# Patient Record
Sex: Female | Born: 2000 | Race: Black or African American | Hispanic: No | Marital: Single | Smoking: Never smoker
Health system: Southern US, Community
[De-identification: ages and names within clinical notes are randomized; demographics above are authoritative.]

## PROBLEM LIST (undated history)

## (undated) ENCOUNTER — Emergency Department (HOSPITAL_COMMUNITY): Admission: EM | Payer: Medicaid Other | Source: Home / Self Care

## (undated) DIAGNOSIS — Q631 Lobulated, fused and horseshoe kidney: Secondary | ICD-10-CM

---
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
## 2021-04-06 ENCOUNTER — Ambulatory Visit
Admission: EM | Admit: 2021-04-06 | Discharge: 2021-04-06 | Disposition: A | Discharge status: Home / Self Care | Payer: Medicaid Other | Attending: Internal Medicine | Admitting: Internal Medicine

## 2021-04-06 ENCOUNTER — Other Ambulatory Visit: Payer: Self-pay

## 2021-04-06 DIAGNOSIS — Z113 Encounter for screening for infections with a predominantly sexual mode of transmission: Secondary | ICD-10-CM | POA: Insufficient documentation

## 2021-04-06 DIAGNOSIS — N898 Other specified noninflammatory disorders of vagina: Secondary | ICD-10-CM | POA: Diagnosis not present

## 2021-04-06 DIAGNOSIS — Z3202 Encounter for pregnancy test, result negative: Secondary | ICD-10-CM | POA: Insufficient documentation

## 2021-04-06 DIAGNOSIS — J069 Acute upper respiratory infection, unspecified: Secondary | ICD-10-CM | POA: Insufficient documentation

## 2021-04-06 DIAGNOSIS — J029 Acute pharyngitis, unspecified: Secondary | ICD-10-CM | POA: Diagnosis not present

## 2021-04-06 LAB — POCT MONO SCREEN (KUC): Mono, POC: NEGATIVE

## 2021-04-06 LAB — POCT URINALYSIS DIP (MANUAL ENTRY)
Bilirubin, UA: NEGATIVE
Glucose, UA: NEGATIVE mg/dL
Ketones, POC UA: NEGATIVE mg/dL
Leukocytes, UA: NEGATIVE
Nitrite, UA: NEGATIVE
Spec Grav, UA: 1.02 (ref 1.010–1.025)
Urobilinogen, UA: 0.2 E.U./dL
pH, UA: 7.5 (ref 5.0–8.0)

## 2021-04-06 LAB — POCT RAPID STREP A (OFFICE): Rapid Strep A Screen: NEGATIVE

## 2021-04-06 LAB — POCT URINE PREGNANCY: Preg Test, Ur: NEGATIVE

## 2021-04-06 NOTE — ED Provider Notes (Signed)
[facility]    CSN: [id number] Arrival date & time: 04/06/21  1645      History   Chief Complaint Chief Complaint  Patient presents with   Nasal Congestion   Cough    HPI Patricia Sullivan is a 20 y.o. female.   Patient presents with 3-day history of runny nose, cough, sore throat.  Patient reports that cough is productive.  Denies any fevers or known sick contacts.  Denies chest pain, shortness of breath, nausea, vomiting, abdominal pain, diarrhea.  Patient has not yet taken any medications to help alleviate symptoms.  Patient also requesting a monotest.  Patient also requesting routine STD testing including blood work to test for HIV and syphilis.  Patient denies any known exposure but has had unprotected sexual intercourse recently.  Patient reports that she has had a vaginal odor but denies urinary burning, urinary frequency, vaginal discharge, irregular vaginal bleeding, pelvic pain, fever, abdominal pain.  Patient is currently on her menstrual cycle.  Patient requesting urine pregnancy test as well.  Denies irregular menstrual cycles.  Patient is not currently on any type of birth control.   Cough  Past Medical History:  Diagnosis Date   Horseshoe kidney     Patient Active Problem List   Diagnosis Date Noted   Adjustment disorder with mixed anxiety and depressed mood 05/05/2020    History reviewed. No pertinent surgical history.  OB History   No obstetric history on file.      Home Medications    Prior to Admission medications   Medication Sig Start Date End Date Taking? Authorizing Provider  Mouthwash Compounding Base (MOUTH WASH-GP) LIQD Swish and spit 10 mLs as needed. Compounding solution;Maalox; 61ml,Lidocaine viscous 2%:80 ml; Benadryl 12.5mg /94ml; 80 ml 07/22/20   Lamptey, Myrene Galas, MD  naproxen (NAPROSYN) 375 MG tablet Take 1 tablet (375 mg total) by mouth 2 (two) times daily. 09/21/20   Dorie Rank, MD  ondansetron (ZOFRAN ODT) 8 MG  disintegrating tablet Take 1 tablet (8 mg total) by mouth every 8 (eight) hours as needed for nausea or vomiting. 09/21/20   Dorie Rank, MD  Vitamin D, Ergocalciferol, (DRISDOL) 1.25 MG (50000 UNIT) CAPS capsule Take 50,000 Units by mouth once a week. 01/25/21   [provider]    Family History Family History  Problem Relation Age of Onset   Healthy Mother    Healthy Father     Social History Social History   Tobacco Use   Smoking status: Never   Smokeless tobacco: Never  Substance Use Topics   Alcohol use: Yes    Comment: Occasionally    Drug use: Yes    Types: Marijuana    Comment: Occasionally     Allergies   Patient has no known allergies.   Review of Systems Review of Systems Per HPI  Physical Exam Triage Vital Signs ED Triage Vitals  Enc Vitals Group     BP 04/06/21 1718 111/71     Pulse Rate 04/06/21 1718 84     Resp 04/06/21 1718 18     Temp 04/06/21 1718 98.5 F (36.9 C)     Temp Source 04/06/21 1718 Oral     SpO2 04/06/21 1718 97 %     Weight --      Height --      Head Circumference --      Peak Flow --      Pain Score 04/06/21 1721 4     Pain Loc --  Pain Edu? --      Excl. in GC? --    No data found.  Updated Vital Signs BP 111/71 (BP Location: Right Arm)   Pulse 84   Temp 98.5 F (36.9 C) (Oral)   Resp 18   LMP 04/06/2021 (Exact Date)   SpO2 97%   Visual Acuity Right Eye Distance:   Left Eye Distance:   Bilateral Distance:    Right Eye Near:   Left Eye Near:    Bilateral Near:     Physical Exam Constitutional:      General: She is not in acute distress.    Appearance: Normal appearance. She is not toxic-appearing or diaphoretic.  HENT:     Head: Normocephalic and atraumatic.     Right Ear: Tympanic membrane and ear canal normal.     Left Ear: Tympanic membrane and ear canal normal.     Nose: Congestion present.     Mouth/Throat:     Mouth: Mucous membranes are moist.     Pharynx: Posterior oropharyngeal  erythema present.  Eyes:     Extraocular Movements: Extraocular movements intact.     Conjunctiva/sclera: Conjunctivae normal.     Pupils: Pupils are equal, round, and reactive to light.  Cardiovascular:     Rate and Rhythm: Normal rate and regular rhythm.     Pulses: Normal pulses.     Heart sounds: Normal heart sounds.  Pulmonary:     Effort: Pulmonary effort is normal. No respiratory distress.     Breath sounds: Normal breath sounds. No stridor. No wheezing, rhonchi or rales.  Abdominal:     General: Abdomen is flat. Bowel sounds are normal.     Palpations: Abdomen is soft.  Genitourinary:    Comments: Deferred with shared decision making.  Self swab performed. Musculoskeletal:        General: Normal range of motion.     Cervical back: Normal range of motion.  Skin:    General: Skin is warm and dry.  Neurological:     General: No focal deficit present.     Mental Status: She is alert and oriented to person, place, and time. Mental status is at baseline.  Psychiatric:        Mood and Affect: Mood normal.        Behavior: Behavior normal.     UC Treatments / Results  Labs (all labs ordered are listed, but only abnormal results are displayed) Labs Reviewed  POCT URINALYSIS DIP (MANUAL ENTRY) - Abnormal; Notable for the following components:      Result Value   Clarity, UA cloudy (*)    Blood, UA moderate (*)    Protein Ur, POC trace (*)    All other components within normal limits  CULTURE, GROUP A STREP ([facility])  COVID-19, FLU A+B AND RSV  HIV ANTIBODY (ROUTINE TESTING W REFLEX)  RPR  POCT RAPID STREP A (OFFICE)  POCT URINE PREGNANCY  POCT MONO SCREEN ([facility])  CERVICOVAGINAL ANCILLARY ONLY    EKG   Radiology No results found.  Procedures Procedures (including critical care time)  Medications Ordered in UC Medications - No data to display  Initial Impression / Assessment and Plan / UC Course  I have reviewed the triage vital signs and the nursing  notes.  Pertinent labs & imaging results that were available during my care of the patient were reviewed by me and considered in my medical decision making (see chart for details).     Patient presents  with symptoms likely from a viral upper respiratory infection. Differential includes bacterial pneumonia, sinusitis, allergic rhinitis, Covid 19, flu. Do not suspect underlying cardiopulmonary process. Symptoms seem unlikely related to ACS, CHF or COPD exacerbations, pneumonia, pneumothorax. Patient is nontoxic appearing and not in need of emergent medical intervention.  Rapid strep test was negative.  Rapid monotest negative.  Throat culture and COVID-19, flu, RSV test pending.  Recommended symptom control with over the counter medications: Daily oral anti-histamine, Oral decongestant or IN corticosteroid, saline irrigations, cepacol lozenges, Robitussin, Delsym, honey tea.  Urinalysis did not show signs of urinary tract infection.  Urine culture is pending.  Vaginal swab pending.  Will await results before treatment.  Patient to refrain from sexual activity until test results and treatment are complete.  HIV and syphilis blood work pending per patient request.  Advised patient that urine pregnancy test is not necessary given that she is currently on her menstrual cycle, but patient still requesting urine pregnancy test.  It was negative.  Return if symptoms fail to improve in 1-2 weeks or you develop shortness of breath, chest pain, severe headache. Patient states understanding and is agreeable.  Discharged with PCP followup.  Final Clinical Impressions(s) / UC Diagnoses   Final diagnoses:  Viral upper respiratory tract infection with cough  Sore throat  Vaginal odor  Screening examination for venereal disease  Pregnancy test negative     Discharge Instructions      You likely having a viral upper respiratory infection. We recommended symptom control. I expect your symptoms to start  improving in the next 1-2 weeks.   1. Take a daily allergy pill/anti-histamine like Zyrtec, Claritin, or Store brand consistently for 2 weeks  2. For congestion you may try an oral decongestant like Mucinex or sudafed. You may also try intranasal flonase nasal spray or saline irrigations (neti pot, sinus cleanse)  3. For your sore throat you may try cepacol lozenges, salt water gargles, throat spray. Treatment of congestion may also help your sore throat.  4. For cough you may try Robitussen, Mucinex DM  5. Take Tylenol or Ibuprofen to help with pain/inflammation  6. Stay hydrated, drink plenty of fluids to keep throat coated and less irritated  Honey Tea For cough/sore throat try using a honey-based tea. Use 3 teaspoons of honey with juice squeezed from half lemon. Place shaved pieces of ginger into 1/2-1 cup of water and warm over stove top. Then mix the ingredients and repeat every 4 hours as needed.   Your rapid strep test was negative.  Rapid monotest was negative.  Urine pregnancy was negative.  COVID-19, flu, RSV test is pending.  Blood work is pending.  Vaginal swab is pending.  We will call if any of these things are positive.  Please refrain from sexual activity until test results are complete.     ED Prescriptions   None    PDMP not reviewed this encounter.   Teodora Medici, [place] 04/06/21 [phone]

## 2021-04-06 NOTE — ED Triage Notes (Signed)
3 day h/o rhinorrhea and cough. Pt reports at the onset she had a sore throat w/dysphagia that has since resolved. Pt also complains of HA and back aches, but she's not sure if it's related to starting her MP.  Pt is also requesting STD testing including blood work, Upreg and a mono screen. Denies sxs, but notes dx of chlamydia in 8/22 and while being tx Pt continued to engage in intercourse.

## 2021-04-06 NOTE — Discharge Instructions (Signed)
You likely having a viral upper respiratory infection. We recommended symptom control. I expect your symptoms to start improving in the next 1-2 weeks.   1. Take a daily allergy pill/anti-histamine like Zyrtec, Claritin, or Store brand consistently for 2 weeks  2. For congestion you may try an oral decongestant like Mucinex or sudafed. You may also try intranasal flonase nasal spray or saline irrigations (neti pot, sinus cleanse)  3. For your sore throat you may try cepacol lozenges, salt water gargles, throat spray. Treatment of congestion may also help your sore throat.  4. For cough you may try Robitussen, Mucinex DM  5. Take Tylenol or Ibuprofen to help with pain/inflammation  6. Stay hydrated, drink plenty of fluids to keep throat coated and less irritated  Honey Tea For cough/sore throat try using a honey-based tea. Use 3 teaspoons of honey with juice squeezed from half lemon. Place shaved pieces of ginger into 1/2-1 cup of water and warm over stove top. Then mix the ingredients and repeat every 4 hours as needed.   Your rapid strep test was negative.  Rapid monotest was negative.  Urine pregnancy was negative.  COVID-19, flu, RSV test is pending.  Blood work is pending.  Vaginal swab is pending.  We will call if any of these things are positive.  Please refrain from sexual activity until test results are complete.

## 2021-04-07 ENCOUNTER — Telehealth (HOSPITAL_COMMUNITY): Payer: Self-pay | Admitting: Emergency Medicine

## 2021-04-07 LAB — CERVICOVAGINAL ANCILLARY ONLY
Bacterial Vaginitis (gardnerella): POSITIVE — AB
Candida Glabrata: NEGATIVE
Candida Vaginitis: POSITIVE — AB
Chlamydia: NEGATIVE
Comment: NEGATIVE
Comment: NEGATIVE
Comment: NEGATIVE
Comment: NEGATIVE
Comment: NEGATIVE
Comment: NORMAL
Neisseria Gonorrhea: NEGATIVE
Trichomonas: NEGATIVE

## 2021-04-07 LAB — COVID-19, FLU A+B AND RSV
Influenza A, NAA: NOT DETECTED
Influenza B, NAA: NOT DETECTED
RSV, NAA: NOT DETECTED
SARS-CoV-2, NAA: NOT DETECTED

## 2021-04-07 LAB — RPR: RPR Ser Ql: NONREACTIVE

## 2021-04-07 LAB — HIV ANTIBODY (ROUTINE TESTING W REFLEX): HIV Screen 4th Generation wRfx: NONREACTIVE

## 2021-04-07 MED ORDER — METRONIDAZOLE 500 MG PO TABS: 500.0000 mg | ORAL_TABLET | Freq: Two times a day (BID) | ORAL | 0 refills | Status: DC

## 2021-04-07 MED ORDER — FLUCONAZOLE 150 MG PO TABS: 150.0000 mg | ORAL_TABLET | Freq: Once | ORAL | 0 refills | Status: AC

## 2021-04-10 LAB — CULTURE, GROUP A STREP (THRC)

## 2021-05-17 ENCOUNTER — Encounter: Payer: Self-pay | Admitting: Emergency Medicine

## 2021-05-17 ENCOUNTER — Ambulatory Visit
Admission: EM | Admit: 2021-05-17 | Discharge: 2021-05-17 | Disposition: A | Discharge status: Home / Self Care | Payer: Medicaid Other | Attending: Internal Medicine | Admitting: Internal Medicine

## 2021-05-17 ENCOUNTER — Other Ambulatory Visit: Payer: Self-pay

## 2021-05-17 DIAGNOSIS — N898 Other specified noninflammatory disorders of vagina: Secondary | ICD-10-CM | POA: Diagnosis not present

## 2021-05-17 DIAGNOSIS — Z113 Encounter for screening for infections with a predominantly sexual mode of transmission: Secondary | ICD-10-CM | POA: Insufficient documentation

## 2021-05-17 LAB — POCT URINE PREGNANCY: Preg Test, Ur: NEGATIVE

## 2021-05-17 NOTE — ED Triage Notes (Signed)
Didn't take her BV pills as prescribed. Over the last two days noticed a cottage cheese discharge, and has a strong vaginal odor.

## 2021-05-17 NOTE — ED Provider Notes (Signed)
[name]    CSN: [id number] Arrival date & time: 05/17/21  1120      History   Chief Complaint Chief Complaint  Patient presents with   Vaginal Discharge    HPI Patricia Sullivan is a 20 y.o. female.   Patient presents with white vaginal discharge that started approximately 3 to 4 days ago.  Is also having a vaginal odor.  Denies any urinary burning, urinary frequency, hematuria, pelvic pain, abdominal pain, fever, back pain.  Denies any known exposure to STD.  Patient was recently seen on 04/06/2021 and tested positive for bacterial vaginosis as well as vaginal yeast.  Was prescribed Flagyl and Diflucan.  Patient reports that she did not complete the Flagyl and had approximately 3 pills left.  Although, those symptoms resolved and these are new symptoms.  Patient also requesting pregnancy test.  Denies missed period.  Last menstrual cycle was 05/01/21.    Vaginal Discharge  Past Medical History:  Diagnosis Date   Horseshoe kidney     Patient Active Problem List   Diagnosis Date Noted   Adjustment disorder with mixed anxiety and depressed mood 05/05/2020    History reviewed. No pertinent surgical history.  OB History   No obstetric history on file.      Home Medications    Prior to Admission medications   Medication Sig Start Date End Date Taking? Authorizing Provider  metroNIDAZOLE (FLAGYL) 500 MG tablet Take 1 tablet (500 mg total) by mouth 2 (two) times daily. 04/07/21   Lamptey, Myrene Galas, MD  Mouthwash Compounding Base (MOUTH WASH-GP) LIQD Swish and spit 10 mLs as needed. Compounding solution;Maalox; 7ml,Lidocaine viscous 2%:80 ml; Benadryl 12.5mg /8ml; 80 ml 07/22/20   Lamptey, Myrene Galas, MD  naproxen (NAPROSYN) 375 MG tablet Take 1 tablet (375 mg total) by mouth 2 (two) times daily. 09/21/20   Dorie Rank, MD  ondansetron (ZOFRAN ODT) 8 MG disintegrating tablet Take 1 tablet (8 mg total) by mouth every 8 (eight) hours as needed for nausea or vomiting.  09/21/20   Dorie Rank, MD  Vitamin D, Ergocalciferol, (DRISDOL) 1.25 MG (50000 UNIT) CAPS capsule Take 50,000 Units by mouth once a week. 01/25/21   [provider]    Family History Family History  Problem Relation Age of Onset   Healthy Mother    Healthy Father     Social History Social History   Tobacco Use   Smoking status: Never   Smokeless tobacco: Never  Substance Use Topics   Alcohol use: Yes    Comment: Occasionally    Drug use: Yes    Types: Marijuana    Comment: Occasionally     Allergies   Patient has no known allergies.   Review of Systems Review of Systems Per HPI  Physical Exam Triage Vital Signs ED Triage Vitals  Enc Vitals Group     BP 05/17/21 1217 117/71     Pulse Rate 05/17/21 1217 100     Resp 05/17/21 1217 16     Temp 05/17/21 1217 98.6 F (37 C)     Temp Source 05/17/21 1217 Oral     SpO2 05/17/21 1217 100 %     Weight --      Height --      Head Circumference --      Peak Flow --      Pain Score 05/17/21 1223 0     Pain Loc --      Pain Edu? --  Excl. in GC? --    No data found.  Updated Vital Signs BP 117/71 (BP Location: Left Arm)   Pulse 100   Temp 98.6 F (37 C) (Oral)   Resp 16   SpO2 100%   Visual Acuity Right Eye Distance:   Left Eye Distance:   Bilateral Distance:    Right Eye Near:   Left Eye Near:    Bilateral Near:     Physical Exam Constitutional:      General: She is not in acute distress.    Appearance: Normal appearance. She is not toxic-appearing or diaphoretic.  HENT:     Head: Normocephalic and atraumatic.  Eyes:     Extraocular Movements: Extraocular movements intact.     Conjunctiva/sclera: Conjunctivae normal.  Pulmonary:     Effort: Pulmonary effort is normal.  Genitourinary:    Comments: Deferred with shared decision making.  Self swab performed. Neurological:     General: No focal deficit present.     Mental Status: She is alert and oriented to person, place, and time.  Mental status is at baseline.  Psychiatric:        Mood and Affect: Mood normal.        Behavior: Behavior normal.        Thought Content: Thought content normal.        Judgment: Judgment normal.     UC Treatments / Results  Labs (all labs ordered are listed, but only abnormal results are displayed) Labs Reviewed  POCT URINE PREGNANCY  CERVICOVAGINAL ANCILLARY ONLY    EKG   Radiology No results found.  Procedures Procedures (including critical care time)  Medications Ordered in UC Medications - No data to display  Initial Impression / Assessment and Plan / UC Course  I have reviewed the triage vital signs and the nursing notes.  Pertinent labs & imaging results that were available during my care of the patient were reviewed by me and considered in my medical decision making (see chart for details).     Will await cervicovaginal swab to determine what could be causing vaginal discharge as these are new symptoms from previous urgent care visit.  Patient to refrain from sexual activity until test results and treatment are complete.  Discussed strict return precautions.  Patient verbalized understanding and was agreeable with plan. Final Clinical Impressions(s) / UC Diagnoses   Final diagnoses:  Vaginal discharge  Screening examination for venereal disease     Discharge Instructions      Your vaginal swab is pending.  We will call if it is positive and treat as appropriate.  Please refrain from sexual activity until test results and treatment are complete.  Urine pregnancy test was negative.     ED Prescriptions   None    PDMP not reviewed this encounter.   Gustavus Bryant, [place] 05/17/21 1252

## 2021-05-17 NOTE — Discharge Instructions (Signed)
Your vaginal swab is pending.  We will call if it is positive and treat as appropriate.  Please refrain from sexual activity until test results and treatment are complete.  Urine pregnancy test was negative.

## 2021-05-18 LAB — CERVICOVAGINAL ANCILLARY ONLY
Bacterial Vaginitis (gardnerella): POSITIVE — AB
Candida Glabrata: NEGATIVE
Candida Vaginitis: POSITIVE — AB
Chlamydia: NEGATIVE
Comment: NEGATIVE
Comment: NEGATIVE
Comment: NEGATIVE
Comment: NEGATIVE
Comment: NEGATIVE
Comment: NORMAL
Neisseria Gonorrhea: POSITIVE — AB
Trichomonas: NEGATIVE

## 2021-05-19 ENCOUNTER — Telehealth (HOSPITAL_COMMUNITY): Payer: Self-pay | Admitting: Emergency Medicine

## 2021-05-19 MED ORDER — FLUCONAZOLE 150 MG PO TABS: 150.0000 mg | ORAL_TABLET | Freq: Once | ORAL | 0 refills | Status: AC

## 2021-05-19 MED ORDER — METRONIDAZOLE 500 MG PO TABS: 500.0000 mg | ORAL_TABLET | Freq: Two times a day (BID) | ORAL | 0 refills | Status: DC

## 2021-05-19 NOTE — Telephone Encounter (Signed)
Per protocol, patient will need treatment with IM Rocephin 500mg  for positive Gonorrhea. Will also need treatment with Metronidazole and Diflucan.   Contacted patient by phone.  Verified identity using two identifiers.  Provided positive result.  Reviewed safe sex practices, notifying partners, and refraining from sexual activities for 7 days from time of treatment.  Patient verified understanding, all questions answered.   [facility]HS notified Verified pharmacy, prescriptions sent

## 2021-05-21 ENCOUNTER — Other Ambulatory Visit: Payer: Self-pay

## 2021-05-21 ENCOUNTER — Ambulatory Visit
Admission: RE | Admit: 2021-05-21 | Discharge: 2021-05-21 | Disposition: A | Discharge status: Home / Self Care | Payer: Medicaid Other | Source: Ambulatory Visit | Attending: Internal Medicine | Admitting: Internal Medicine

## 2021-05-21 VITALS — BP 110/73 | HR 80 | Temp 98.5°F | Resp 18

## 2021-05-21 DIAGNOSIS — A549 Gonococcal infection, unspecified: Secondary | ICD-10-CM

## 2021-05-21 DIAGNOSIS — Z113 Encounter for screening for infections with a predominantly sexual mode of transmission: Secondary | ICD-10-CM

## 2021-05-21 MED ORDER — CEFTRIAXONE SODIUM 1 G IJ SOLR
0.5000 g | Freq: Once | INTRAMUSCULAR | Status: AC
  Administered 2021-05-21: 0.5 g via INTRAMUSCULAR

## 2021-05-21 NOTE — ED Triage Notes (Signed)
Pt here for rn visit for sti result.   However also asking for throat and anal swabs to be sent so will need to be clinical visit.

## 2021-05-21 NOTE — Discharge Instructions (Addendum)
rocephin injection was administered in urgent care today for gonorrhea.  Your blood work is pending.  We will call if it is positive.

## 2021-05-21 NOTE — ED Provider Notes (Signed)
[name]    CSN: [id number] Arrival date & time: 05/21/21  1756      History   Chief Complaint Chief Complaint  Patient presents with   sti screening    HPI Patricia Sullivan is a 20 y.o. female.   Patient originally presented today for treatment for gonorrhea as she tested positive for gonorrhea on 05/17/2021.  Although, parent is present with patient today and is requesting oral and anal swabs for STDs as well as blood work for HIV and syphilis.  Patient denies that she is having any sore throat or anal symptoms related to STDs.  Denies any known exposure to HIV and syphilis but requesting routine testing and is agreeable with parent for blood work.    Past Medical History:  Diagnosis Date   Horseshoe kidney     Patient Active Problem List   Diagnosis Date Noted   Adjustment disorder with mixed anxiety and depressed mood 05/05/2020    History reviewed. No pertinent surgical history.  OB History   No obstetric history on file.      Home Medications    Prior to Admission medications   Medication Sig Start Date End Date Taking? Authorizing Provider  metroNIDAZOLE (FLAGYL) 500 MG tablet Take 1 tablet (500 mg total) by mouth 2 (two) times daily. 05/19/21   Lamptey, Britta Mccreedy, MD  Mouthwash Compounding Base (MOUTH WASH-GP) LIQD Swish and spit 10 mLs as needed. Compounding solution;Maalox; 52ml,Lidocaine viscous 2%:80 ml; Benadryl 12.5mg /23ml; 80 ml 07/22/20   Lamptey, Britta Mccreedy, MD  naproxen (NAPROSYN) 375 MG tablet Take 1 tablet (375 mg total) by mouth 2 (two) times daily. 09/21/20   Linwood Dibbles, MD  ondansetron (ZOFRAN ODT) 8 MG disintegrating tablet Take 1 tablet (8 mg total) by mouth every 8 (eight) hours as needed for nausea or vomiting. 09/21/20   Linwood Dibbles, MD  Vitamin D, Ergocalciferol, (DRISDOL) 1.25 MG (50000 UNIT) CAPS capsule Take 50,000 Units by mouth once a week. 01/25/21   [provider]    Family History Family History  Problem  Relation Age of Onset   Healthy Mother    Healthy Father     Social History Social History   Tobacco Use   Smoking status: Never   Smokeless tobacco: Never  Substance Use Topics   Alcohol use: Yes    Comment: Occasionally    Drug use: Yes    Types: Marijuana    Comment: Occasionally     Allergies   Patient has no known allergies.   Review of Systems Review of Systems Per HPI  Physical Exam Triage Vital Signs ED Triage Vitals  Enc Vitals Group     BP 05/21/21 1915 110/73     Pulse Rate 05/21/21 1915 80     Resp 05/21/21 1915 18     Temp 05/21/21 1915 98.5 F (36.9 C)     Temp Source 05/21/21 1915 Oral     SpO2 05/21/21 1915 98 %     Weight --      Height --      Head Circumference --      Peak Flow --      Pain Score 05/21/21 1914 0     Pain Loc --      Pain Edu? --      Excl. in GC? --    No data found.  Updated Vital Signs BP 110/73 (BP Location: Right Arm)    Pulse 80    Temp 98.5 F (36.9  C) (Oral)    Resp 18    SpO2 98%   Visual Acuity Right Eye Distance:   Left Eye Distance:   Bilateral Distance:    Right Eye Near:   Left Eye Near:    Bilateral Near:     Physical Exam Constitutional:      General: She is not in acute distress.    Appearance: Normal appearance. She is not toxic-appearing or diaphoretic.  HENT:     Head: Normocephalic and atraumatic.  Eyes:     Extraocular Movements: Extraocular movements intact.     Conjunctiva/sclera: Conjunctivae normal.  Pulmonary:     Effort: Pulmonary effort is normal.  Neurological:     General: No focal deficit present.     Mental Status: She is alert and oriented to person, place, and time. Mental status is at baseline.  Psychiatric:        Mood and Affect: Mood normal.        Behavior: Behavior normal.        Thought Content: Thought content normal.        Judgment: Judgment normal.     UC Treatments / Results  Labs (all labs ordered are listed, but only abnormal results are  displayed) Labs Reviewed  HIV ANTIBODY (ROUTINE TESTING W REFLEX)  RPR    EKG   Radiology No results found.  Procedures Procedures (including critical care time)  Medications Ordered in UC Medications  cefTRIAXone (ROCEPHIN) injection 0.5 g (0.5 g Intramuscular Given 05/21/21 1928)    Initial Impression / Assessment and Plan / UC Course  I have reviewed the triage vital signs and the nursing notes.  Pertinent labs & imaging results that were available during my care of the patient were reviewed by me and considered in my medical decision making (see chart for details).     IM Rocephin administered in urgent care today for treatment of gonorrhea with results that were positive on 05/17/2021.  Discussed oral and anal swabs for STDs with parent and patient in room.  Advised parent and patient that it was not necessary given that she is not having any symptoms and is already receiving treatment for gonorrhea today.  Although, advised parent and patient that oral and anal swabs could be completed in urgent care today if they wish for it to be.  Patient declined oral or anal swab testing.  Although, she still wants HIV and syphilis testing.  Blood work pending.  No known exposures at this time.  Discussed strict return precautions.  Patient verbalized understanding and was agreeable with plan. Final Clinical Impressions(s) / UC Diagnoses   Final diagnoses:  Screening examination for venereal disease  Gonorrhea     Discharge Instructions      rocephin injection was administered in urgent care today for gonorrhea.  Your blood work is pending.  We will call if it is positive.    ED Prescriptions   None    PDMP not reviewed this encounter.   Teodora Medici, [place] 05/21/21 [phone]

## 2021-05-25 LAB — HIV ANTIBODY (ROUTINE TESTING W REFLEX): HIV Screen 4th Generation wRfx: NONREACTIVE

## 2021-05-25 LAB — RPR: RPR Ser Ql: NONREACTIVE

## 2021-06-04 ENCOUNTER — Encounter (HOSPITAL_BASED_OUTPATIENT_CLINIC_OR_DEPARTMENT_OTHER): Payer: Self-pay | Admitting: Emergency Medicine

## 2021-06-04 ENCOUNTER — Emergency Department (HOSPITAL_BASED_OUTPATIENT_CLINIC_OR_DEPARTMENT_OTHER)
Admission: EM | Admit: 2021-06-04 | Discharge: 2021-06-04 | Disposition: A | Discharge status: Home / Self Care | Payer: Medicaid Other | Attending: Emergency Medicine | Admitting: Emergency Medicine

## 2021-06-04 ENCOUNTER — Other Ambulatory Visit: Payer: Self-pay

## 2021-06-04 DIAGNOSIS — R519 Headache, unspecified: Secondary | ICD-10-CM | POA: Diagnosis not present

## 2021-06-04 DIAGNOSIS — R197 Diarrhea, unspecified: Secondary | ICD-10-CM | POA: Diagnosis present

## 2021-06-04 DIAGNOSIS — Z20822 Contact with and (suspected) exposure to covid-19: Secondary | ICD-10-CM | POA: Insufficient documentation

## 2021-06-04 DIAGNOSIS — R112 Nausea with vomiting, unspecified: Secondary | ICD-10-CM

## 2021-06-04 LAB — CBC
HCT: 39.3 % (ref 36.0–46.0)
Hemoglobin: 12.6 g/dL (ref 12.0–15.0)
MCH: 29.4 pg (ref 26.0–34.0)
MCHC: 32.1 g/dL (ref 30.0–36.0)
MCV: 91.8 fL (ref 80.0–100.0)
Platelets: 263 10*3/uL (ref 150–400)
RBC: 4.28 MIL/uL (ref 3.87–5.11)
RDW: 13.4 % (ref 11.5–15.5)
WBC: 5.1 10*3/uL (ref 4.0–10.5)
nRBC: 0 % (ref 0.0–0.2)

## 2021-06-04 LAB — COMPREHENSIVE METABOLIC PANEL
ALT: 12 U/L (ref 0–44)
AST: 17 U/L (ref 15–41)
Albumin: 4.4 g/dL (ref 3.5–5.0)
Alkaline Phosphatase: 35 U/L — ABNORMAL LOW (ref 38–126)
Anion gap: 8 (ref 5–15)
BUN: 13 mg/dL (ref 6–20)
CO2: 25 mmol/L (ref 22–32)
Calcium: 9.3 mg/dL (ref 8.9–10.3)
Chloride: 104 mmol/L (ref 98–111)
Creatinine, Ser: 0.71 mg/dL (ref 0.44–1.00)
GFR, Estimated: >60 mL/min (ref >60)
Glucose, Bld: 92 mg/dL (ref 70–99)
Potassium: 4 mmol/L (ref 3.5–5.1)
Sodium: 137 mmol/L (ref 135–145)
Total Bilirubin: 1.6 mg/dL — ABNORMAL HIGH (ref 0.3–1.2)
Total Protein: 7.7 g/dL (ref 6.5–8.1)

## 2021-06-04 LAB — URINALYSIS, ROUTINE W REFLEX MICROSCOPIC
Bilirubin Urine: NEGATIVE
Glucose, UA: NEGATIVE mg/dL
Ketones, ur: >80 mg/dL — AB
Nitrite: NEGATIVE
RBC / HPF: >50 RBC/hpf — ABNORMAL HIGH (ref 0–5)
Specific Gravity, Urine: 1.029 (ref 1.005–1.030)
pH: 5.5 (ref 5.0–8.0)

## 2021-06-04 LAB — PREGNANCY, URINE: Preg Test, Ur: NEGATIVE

## 2021-06-04 LAB — RESP PANEL BY RT-PCR (FLU A&B, COVID) ARPGX2
Influenza A by PCR: NEGATIVE
Influenza B by PCR: NEGATIVE
SARS Coronavirus 2 by RT PCR: NEGATIVE

## 2021-06-04 LAB — LIPASE, BLOOD: Lipase: 11 U/L (ref 11–51)

## 2021-06-04 MED ORDER — METOCLOPRAMIDE HCL 5 MG/ML IJ SOLN
5.0000 mg | Freq: Once | INTRAMUSCULAR | Status: AC
  Administered 2021-06-04: 10:00:00 5 mg via INTRAVENOUS
  Filled 2021-06-04: qty 2

## 2021-06-04 MED ORDER — ONDANSETRON HCL 4 MG PO TABS
4.0000 mg | ORAL_TABLET | Freq: Four times a day (QID) | ORAL | 0 refills | Status: AC
Start: 2021-06-04 — End: 2021-06-07

## 2021-06-04 MED ORDER — DICYCLOMINE HCL 20 MG PO TABS: 20.0000 mg | ORAL_TABLET | Freq: Two times a day (BID) | ORAL | 0 refills | Status: DC | PRN

## 2021-06-04 MED ORDER — SODIUM CHLORIDE 0.9 % IV BOLUS
500.0000 mL | Freq: Once | INTRAVENOUS | Status: AC
  Administered 2021-06-04: 11:00:00 500 mL via INTRAVENOUS

## 2021-06-04 MED ORDER — DICYCLOMINE HCL 10 MG PO CAPS
20.0000 mg | ORAL_CAPSULE | Freq: Once | ORAL | Status: AC
  Administered 2021-06-04: 10:00:00 20 mg via ORAL
  Filled 2021-06-04: qty 2

## 2021-06-04 MED ORDER — ONDANSETRON HCL 4 MG/2ML IJ SOLN
4.0000 mg | Freq: Once | INTRAMUSCULAR | Status: AC
  Administered 2021-06-04: 10:00:00 4 mg via INTRAVENOUS
  Filled 2021-06-04: qty 2

## 2021-06-04 NOTE — Discharge Instructions (Addendum)
You are seen in the ER for nausea, vomiting and diarrhea.  We talked about the differential for these symptoms, which would include a viral stomach bug is the most likely cause.  Your blood test showed a normal white blood cell count, no evidence of inflammation around your liver or pancreas.  However we also talked about the possibility this may be early appendicitis.  The only way to determine this is with a CT scan.  You did not want a CT scan at this time, and we engaged in shared medical decision making.  I thought it was reasonable to discharge you home and have you continue to watch and wait with your symptoms.  If your vomiting is getting worse, or your pain is localizing into the right lower side of your abdomen, you need to return to the emergency department.  Your urine test also had signs of blood in it.  This may be a sign of a kidney stone.  Most kidney stones passed on their own.  However this would also be diagnosed with a CT scan.  If your symptoms are getting worse, you should come back to the ER, as you will likely need another medical evaluation and possible CT scan at that time.  For now, I will prescribe you Zofran for nausea and Bentyl for abdominal pain and cramping.  If you have a stomach bug expect you feeling better within the next 2 days.  Please call to schedule follow-up appointment with your primary care provider

## 2021-06-04 NOTE — ED Provider Notes (Signed)
[place] [facility] Provider Note   CSN: [id number] Arrival date & time: 06/04/21  [id number]     History Chief Complaint  Patient presents with   Emesis   Diarrhea    Patricia Sullivan is a 20 y.o. female presenting to the emergency department with nausea, vomiting, diarrhea for the past 2 days.  The patient reports that she has had diffuse lower abdominal cramping, more so on the right lower side, for the past 2 days, as well as several episodes of vomiting that began abruptly, and very loose bowel movements.  She is also had some cramping pain in her back since yesterday.  She reports a headache as well.  She says she has not been able to keep down any fluids.  She denies any history of abdominal surgeries.  She denies any concerns for pregnancy.  She reports that she completed a course of Flagyl 3 days ago for BV, denies any alcohol use.  HPI     Past Medical History:  Diagnosis Date   Horseshoe kidney     Patient Active Problem List   Diagnosis Date Noted   Adjustment disorder with mixed anxiety and depressed mood 05/05/2020    History reviewed. No pertinent surgical history.   OB History   No obstetric history on file.     Family History  Problem Relation Age of Onset   Healthy Mother    Healthy Father     Social History   Tobacco Use   Smoking status: Never   Smokeless tobacco: Never  Substance Use Topics   Alcohol use: Yes    Comment: Occasionally    Drug use: Yes    Types: Marijuana    Comment: Occasionally    Home Medications Prior to Admission medications   Medication Sig Start Date End Date Taking? Authorizing Provider  dicyclomine (BENTYL) 20 MG tablet Take 1 tablet (20 mg total) by mouth 2 (two) times daily as needed for up to 20 doses for spasms. 06/04/21  Yes Wyvonnia Dusky, MD  ondansetron (ZOFRAN) 4 MG tablet Take 1 tablet (4 mg total) by mouth every 6 (six) hours for 12 doses. 06/04/21 06/07/21 Yes Jaydrien Wassenaar, Carola Rhine, MD   metroNIDAZOLE (FLAGYL) 500 MG tablet Take 1 tablet (500 mg total) by mouth 2 (two) times daily. 05/19/21   Lamptey, Myrene Galas, MD  Mouthwash Compounding Base (MOUTH WASH-GP) LIQD Swish and spit 10 mLs as needed. Compounding solution;Maalox; 26ml,Lidocaine viscous 2%:80 ml; Benadryl 12.5mg /32ml; 80 ml 07/22/20   Lamptey, Myrene Galas, MD  naproxen (NAPROSYN) 375 MG tablet Take 1 tablet (375 mg total) by mouth 2 (two) times daily. 09/21/20   Dorie Rank, MD  ondansetron (ZOFRAN ODT) 8 MG disintegrating tablet Take 1 tablet (8 mg total) by mouth every 8 (eight) hours as needed for nausea or vomiting. 09/21/20   Dorie Rank, MD  Vitamin D, Ergocalciferol, (DRISDOL) 1.25 MG (50000 UNIT) CAPS capsule Take 50,000 Units by mouth once a week. 01/25/21   [provider]    Allergies    Patient has no known allergies.  Review of Systems   Review of Systems  Constitutional:  Positive for fatigue. Negative for chills and fever.  HENT:  Negative for ear pain and sore throat.   Eyes:  Negative for pain and visual disturbance.  Respiratory:  Negative for cough and shortness of breath.   Cardiovascular:  Negative for chest pain and palpitations.  Gastrointestinal:  Positive for abdominal pain, diarrhea, nausea and vomiting.  Genitourinary:  Negative for dysuria and hematuria.  Musculoskeletal:  Positive for arthralgias and back pain.  Skin:  Negative for color change and rash.  Neurological:  Positive for headaches. Negative for syncope.  All other systems reviewed and are negative.  Physical Exam Updated Vital Signs BP 100/65    Pulse 99    Temp 99.1 F (37.3 C)    Resp 14    Ht 5\' 3"  (1.6 m)    Wt 49.9 kg    LMP 05/19/2021 (Approximate)    SpO2 100%    BMI 19.49 kg/m   Physical Exam Constitutional:      General: She is not in acute distress. HENT:     Head: Normocephalic and atraumatic.  Eyes:     Conjunctiva/sclera: Conjunctivae normal.     Pupils: Pupils are equal, round, and reactive to  light.  Cardiovascular:     Rate and Rhythm: Normal rate and regular rhythm.  Pulmonary:     Effort: Pulmonary effort is normal. No respiratory distress.  Abdominal:     General: There is no distension.     Tenderness: There is generalized abdominal tenderness. There is no right CVA tenderness or guarding. Negative signs include Murphy's sign and McBurney's sign.     Comments: Mild ttp  Skin:    General: Skin is warm and dry.  Neurological:     General: No focal deficit present.     Mental Status: She is alert. Mental status is at baseline.  Psychiatric:        Mood and Affect: Mood normal.        Behavior: Behavior normal.    ED Results / Procedures / Treatments   Labs (all labs ordered are listed, but only abnormal results are displayed) Labs Reviewed  COMPREHENSIVE METABOLIC PANEL - Abnormal; Notable for the following components:      Result Value   Alkaline Phosphatase 35 (*)    Total Bilirubin 1.6 (*)    All other components within normal limits  URINALYSIS, ROUTINE W REFLEX MICROSCOPIC - Abnormal; Notable for the following components:   Hgb urine dipstick LARGE (*)    Ketones, ur >80 (*)    Protein, ur TRACE (*)    Leukocytes,Ua SMALL (*)    RBC / HPF >50 (*)    All other components within normal limits  RESP PANEL BY RT-PCR (FLU A&B, COVID) ARPGX2  LIPASE, BLOOD  CBC  PREGNANCY, URINE    EKG None  Radiology No results found.  Procedures Procedures   Medications Ordered in ED Medications  dicyclomine (BENTYL) capsule 20 mg (20 mg Oral Given 06/04/21 1029)  sodium chloride 0.9 % bolus 500 mL (0 mLs Intravenous Stopped 06/04/21 1141)  ondansetron (ZOFRAN) injection 4 mg (4 mg Intravenous Given 06/04/21 1029)  metoCLOPramide (REGLAN) injection 5 mg (5 mg Intravenous Given 06/04/21 1029)    ED Course  I have reviewed the triage vital signs and the nursing notes.  Pertinent labs & imaging results that were available during my care of the patient were  reviewed by me and considered in my medical decision making (see chart for details).  This patient complains of, vomiting, diarrhea.  This involves an extensive number of treatment options, and is a complaint that carries with it a high risk of complications and morbidity.  The differential diagnosis includes viral gastroenteritis versus early appendicitis versus colitis versus other GI process.  She has no rigidity or guarding of the abdominal exam to suggest GI perforation or surgical abdomen.  There is some vague tenderness suprapubically and in the right lower quadrant, with no focal tenderness at McBurney's point.  We discussed the option of a CT scan to evaluate for early appendicitis, but she would like more time to think about this.  The alternative would be watchful waiting at home with symptomatic management for suspected viral gastroenteritis.  I ordered, reviewed, and interpreted labs.  No life-threatening abnormalities were noted on these tests.  White blood cell count is normal.  UA does not show evidence of infection, but does note large hemoglobin (she is not on her menses) as well as protein and ketones.  Her creatinine is within normal limits.  Electrolytes within normal limits.  She does feel dehydrated, with ketones in the urine, I think a small fluid bolus would be reasonable.  I have also ordered IV Reglan for her headache and stomachache, as well as Bentyl and IV Zofran.  On my reassessment the patient reported that she did not want a CT scan at this time.  I think it is reasonable to perform watchful waiting at home with suspected viral gastroenteritis.  I have a lower suspicion overall at this time for acute appendicitis.  I did make clear to her that we have not ruled this out.  Otherwise she is safe and stable for discharge, and symptomatically improved.  Clinical Course as of 06/04/21 1514  Fri Jun 04, 2021  1228 Patient is feeling significantly better with her medications and  able to tolerate p.o.  She does not want a CT scan at this time, and I think that is reasonable, return precautions were discussed [MT]    Clinical Course User Index [MT] Langston Masker, Carola Rhine, MD     Final Clinical Impression(s) / ED Diagnoses Final diagnoses:  Nausea vomiting and diarrhea    Rx / DC Orders ED Discharge Orders          Ordered    dicyclomine (BENTYL) 20 MG tablet  2 times daily PRN        06/04/21 1234    ondansetron (ZOFRAN) 4 MG tablet  Every 6 hours        06/04/21 1234             Wyvonnia Dusky, MD 06/04/21 1514

## 2021-06-04 NOTE — ED Triage Notes (Signed)
Vomiting, diarrhea, headache, back pain since yesterday

## 2021-07-31 IMAGING — CT CT ABD-PELV W/ CM
2 of 5 series · 16 of 46 positions shown, 18 images · IV contrast (omnipaque)
Comparison: None.

CLINICAL DATA: Right lower quadrant pain

EXAM:
CT ABDOMEN AND PELVIS WITH CONTRAST
TECHNIQUE: Multidetector CT imaging of the abdomen and pelvis was performed
using the standard protocol following bolus administration of
intravenous contrast.
CONTRAST:  100mL OMNIPAQUE IOHEXOL 300 MG/ML  SOLN

[Series 2: axial st · axial · 0.65mm/px · z∈[-585,-225]mm · 13 of 84 slices shown, 15 images]
[im 6/84  soft-tissue]
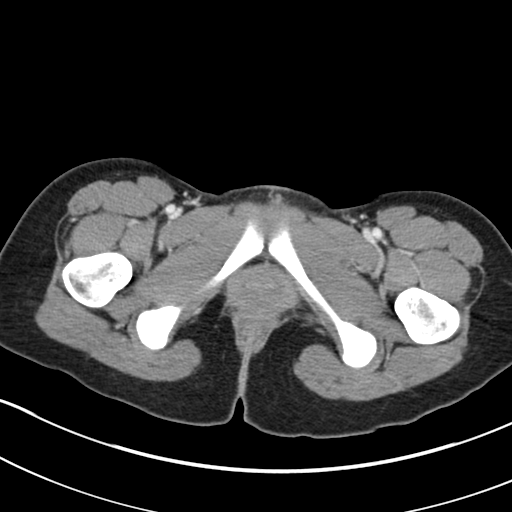
[im 6/84  bone]
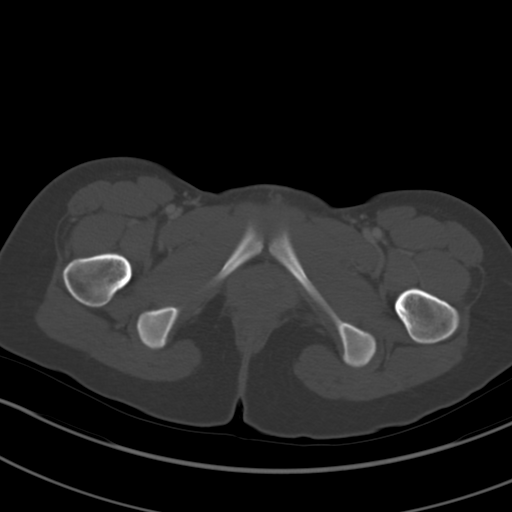
[im 12/84  soft-tissue]
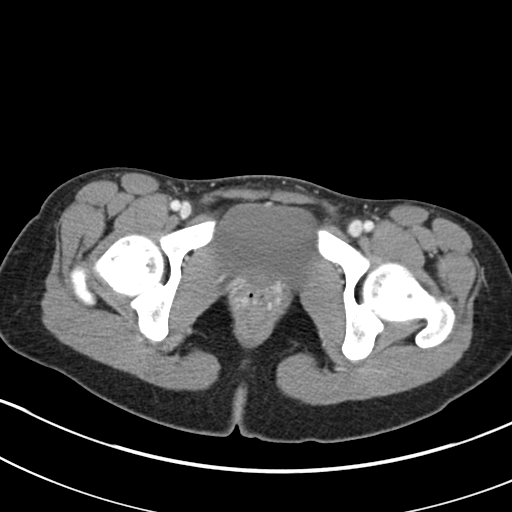
[im 18/84  soft-tissue]
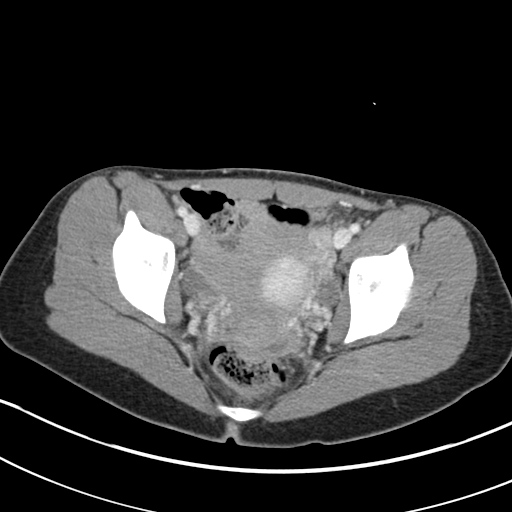
[im 24/84  soft-tissue]
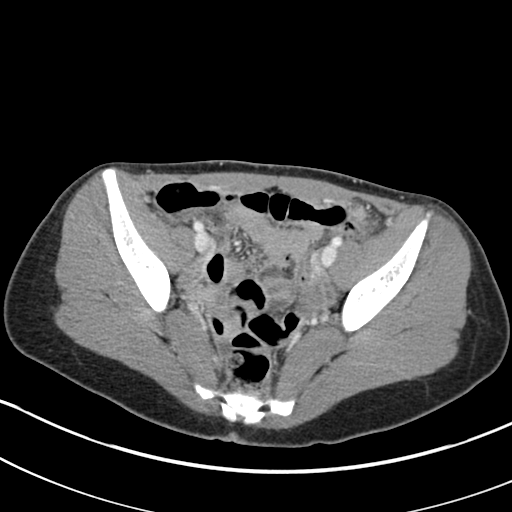
[im 30/84  soft-tissue]
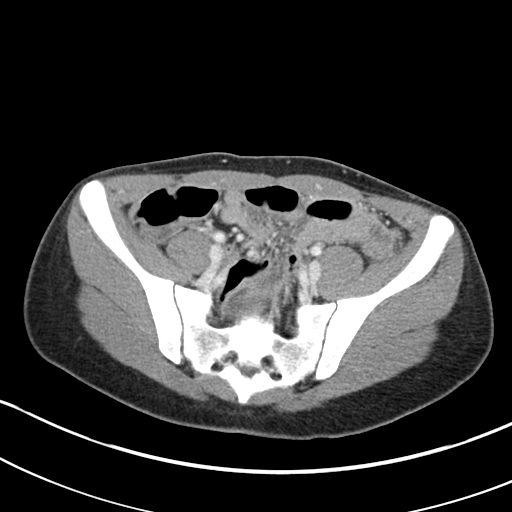
[im 36/84  soft-tissue]
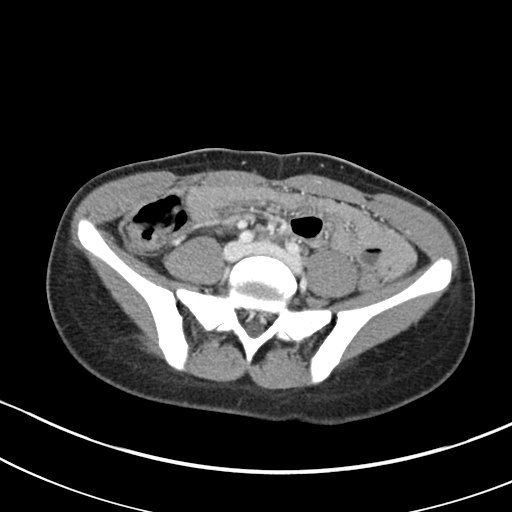
[im 42/84  soft-tissue]
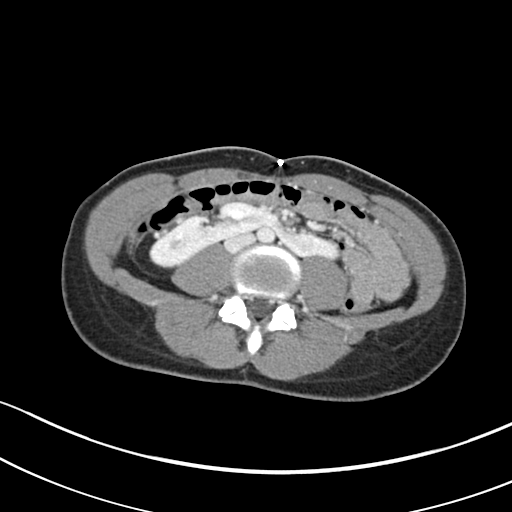
[im 48/84  soft-tissue]
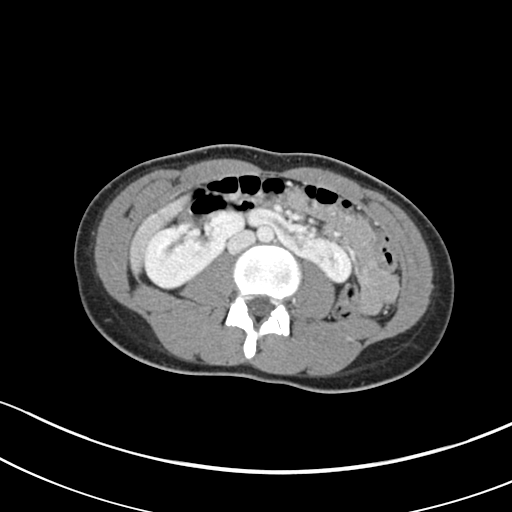
[im 54/84  soft-tissue]
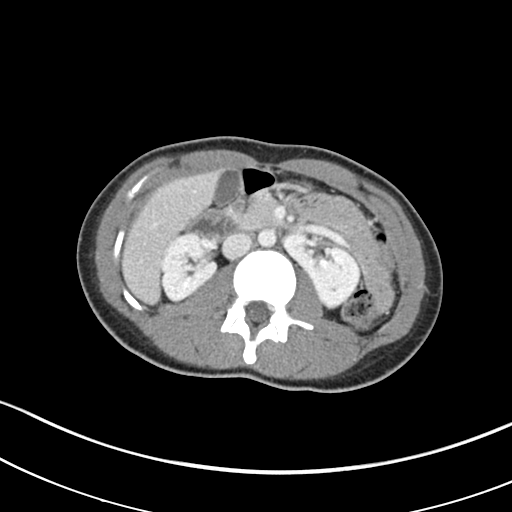
[im 54/84  bone]
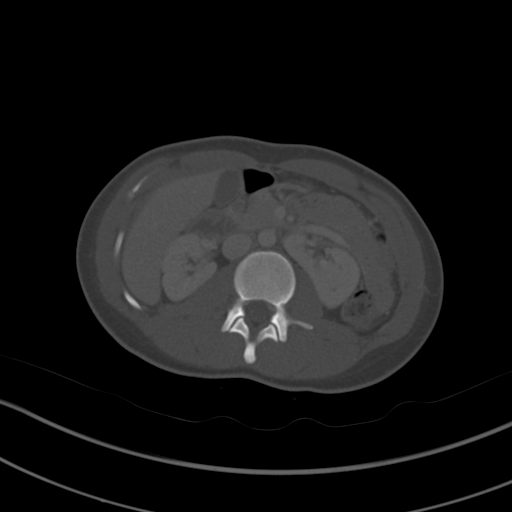
[im 60/84  soft-tissue]
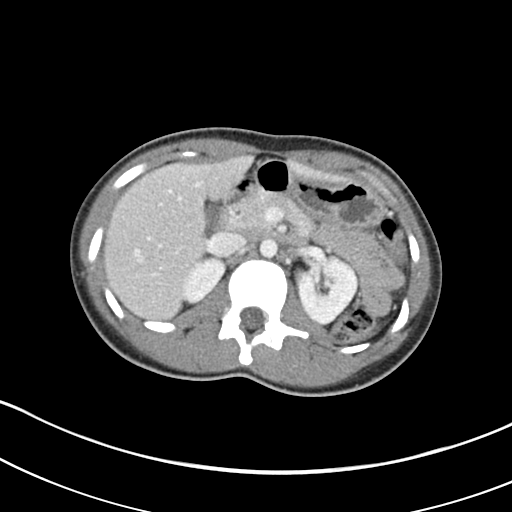
[im 66/84  soft-tissue]
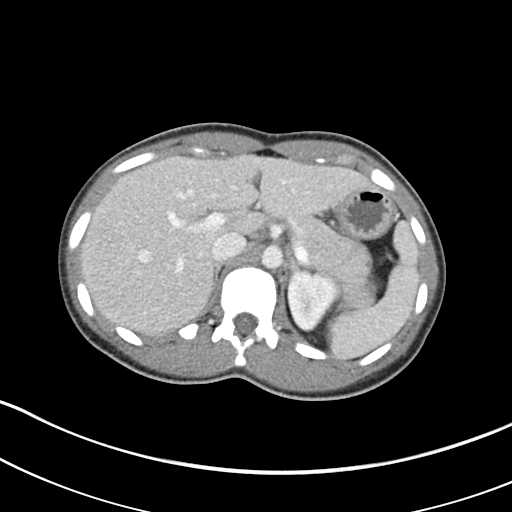
[im 72/84  soft-tissue]
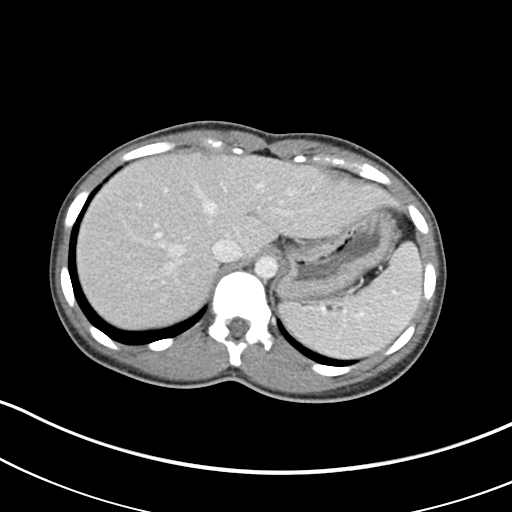
[im 78/84  soft-tissue]
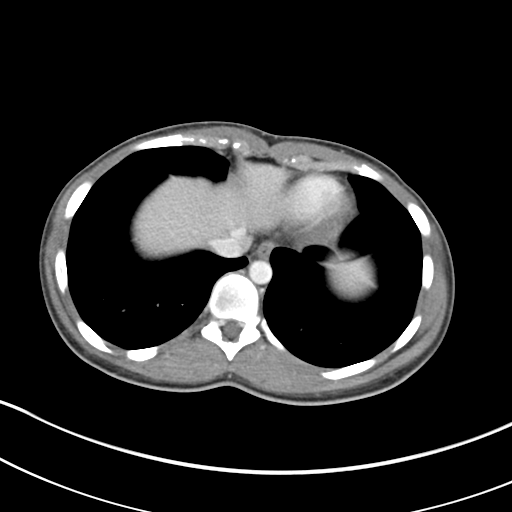

[Series 5: coronal st · coronal · 0.59mm/px · 3 of 96 slices shown]
[im 32/96  soft-tissue]
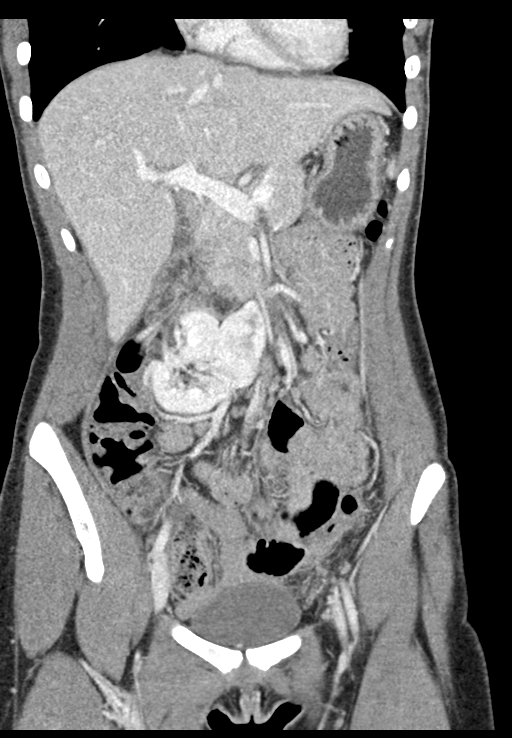
[im 43/96  soft-tissue]
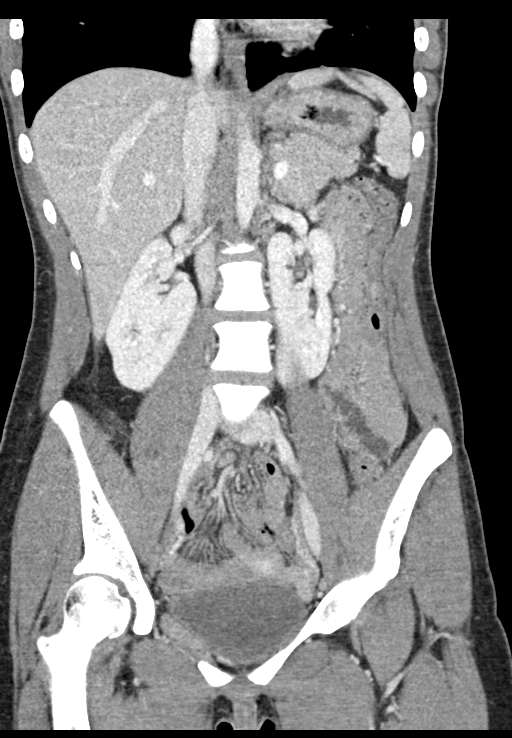
[im 53/96  soft-tissue]
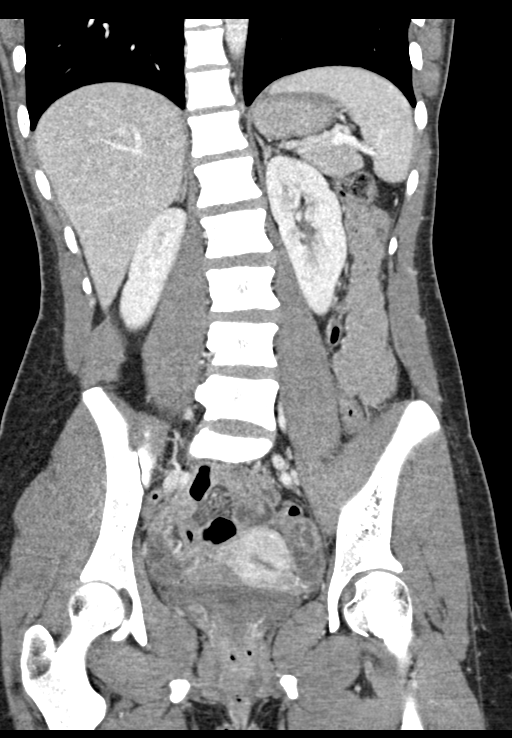

[16 of 46 positions shown; findings below may reference images not displayed]

FINDINGS: Lower chest: Lung bases are clear. No effusions. Heart is normal
size.

Hepatobiliary: Insert paddle biliary

Pancreas: No focal abnormality or ductal dilatation.

Spleen: No focal abnormality.  Normal size.

Adrenals/Urinary Tract: Horseshoe kidney. No hydronephrosis. No
renal or adrenal mass. No stones. Urinary bladder unremarkable.

Stomach/Bowel: Portions of the appendix are visualized and are
normal. Stomach, large and small bowel grossly unremarkable.

Vascular/Lymphatic: No evidence of aneurysm or adenopathy.

Reproductive: Uterus and adnexa unremarkable.  No mass.

Other: No free fluid or free air.

Musculoskeletal: No acute bony abnormality.
IMPRESSION: Portions the appendix are visualized and appear normal. No
inflammatory process in the right lower quadrant.

Horseshoe kidney.

No acute findings in the abdomen or pelvis.

## 2021-08-18 IMAGING — DX DG HAND COMPLETE 3+V*R*
1 series · 3 of 3 positions shown · non-contrast
Comparison: None.

CLINICAL DATA: Lacerations from heading near, evaluate for foreign
bodies

EXAM:
RIGHT HAND - COMPLETE 3+ VIEW

[Series 1: hand · 0.14mm/px · 3 of 3 slices shown]
[im 1/3]
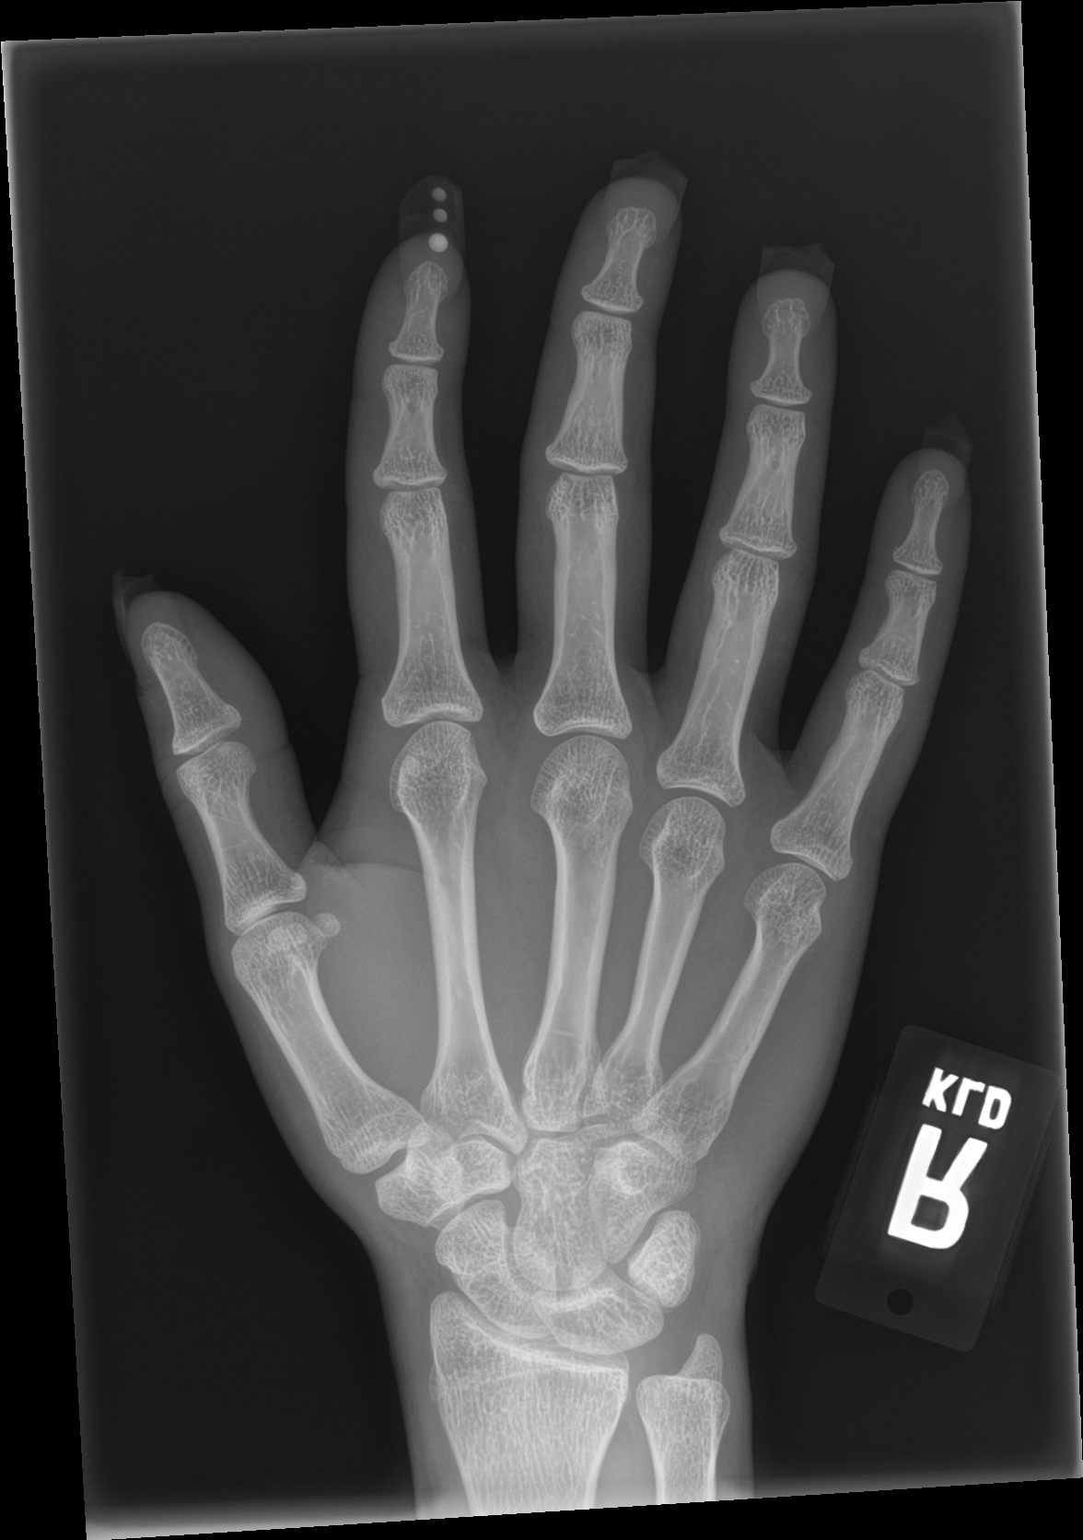
[im 2/3]
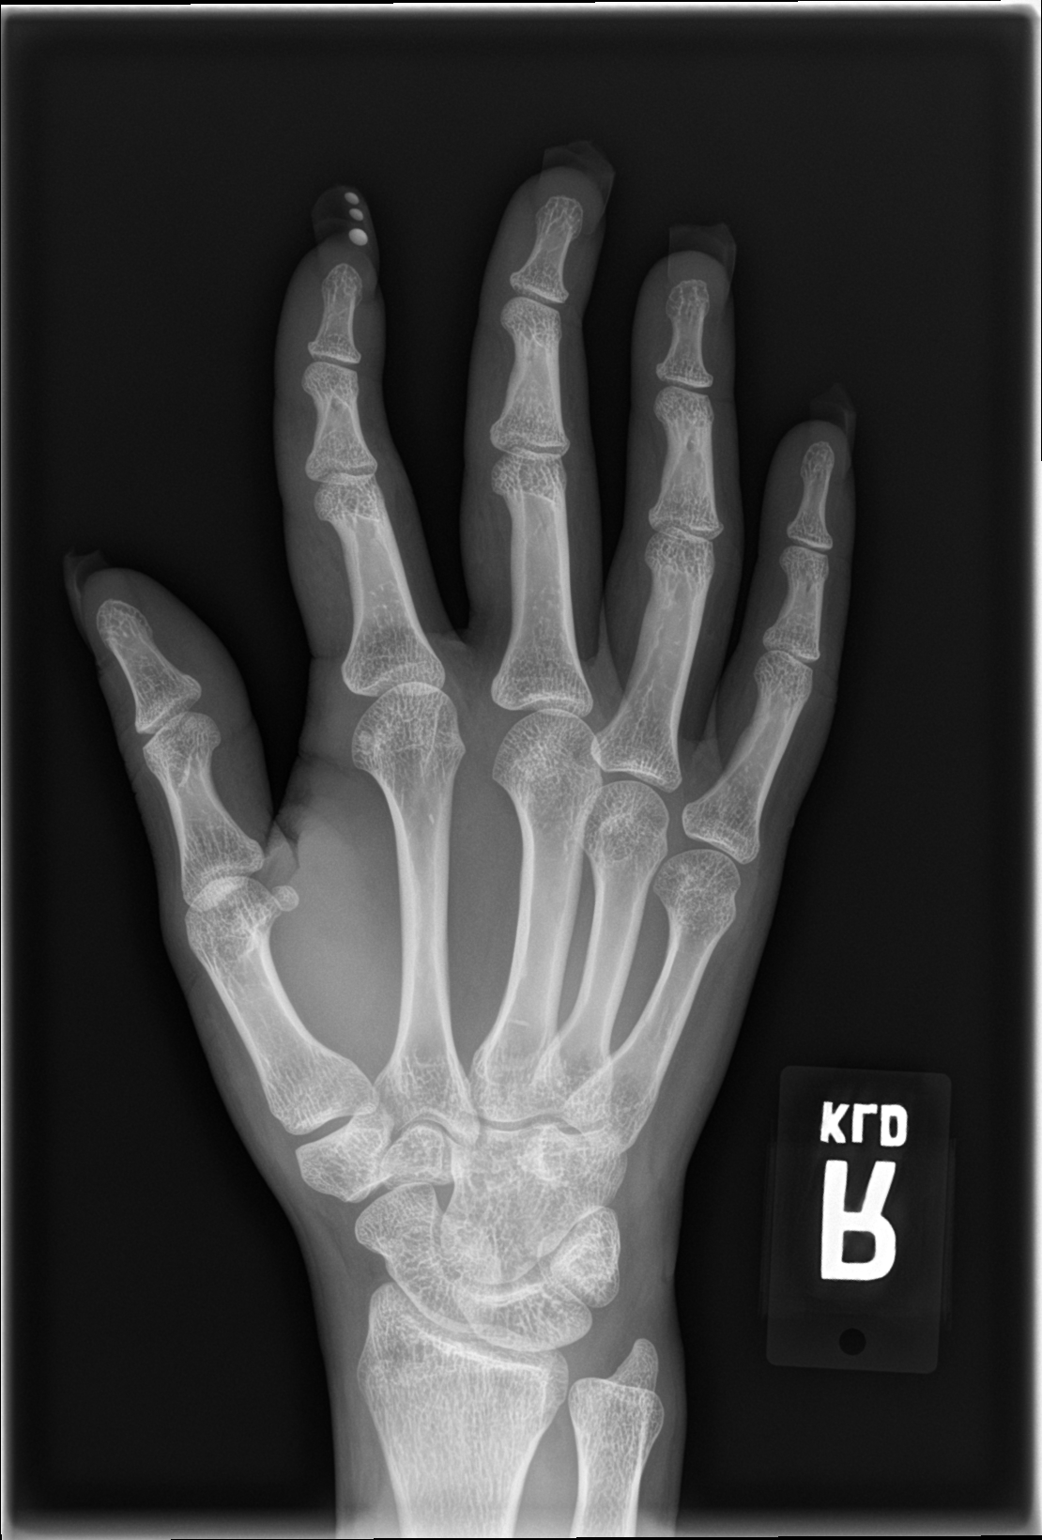
[im 3/3]
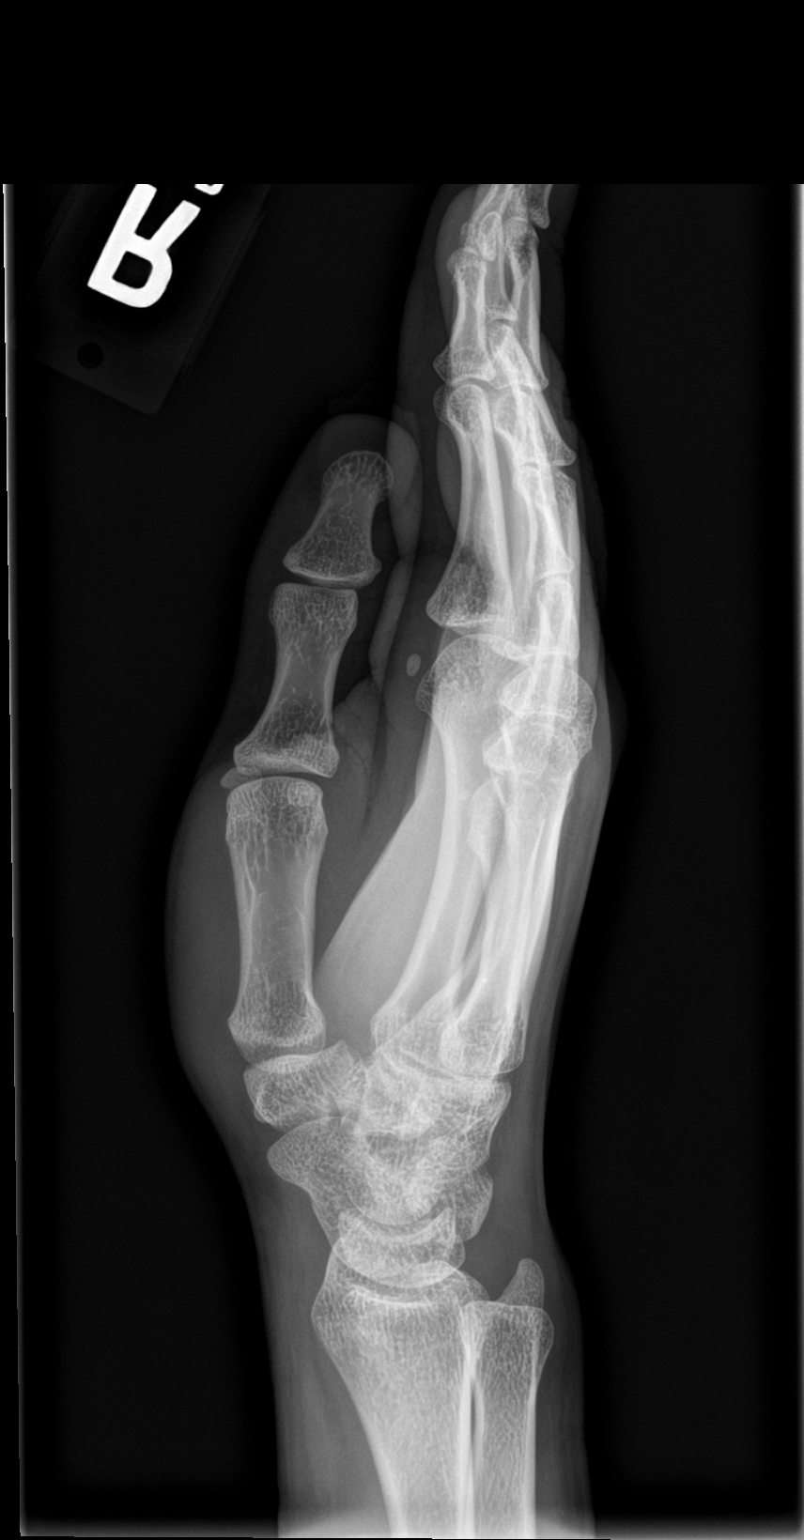

[3 of 3 positions shown; findings below may reference images not displayed]

FINDINGS: Nail extensions on all 5 digits. Radiodense ornamentation of the
second digit extension. Punctate radiodensities seen along the ulnar
base of the fifth digit. No other concerning radiodense foreign
bodies. Remaining soft tissues are unremarkable. No acute bony
abnormality. Specifically, no fracture, subluxation, or dislocation.
IMPRESSION: Punctate radiodensity along the ulnar base of the fifth digit could
reflect small foreign body. No other unexpected radiopaque foreign
bodies.

No acute osseous abnormality.

## 2021-09-21 ENCOUNTER — Ambulatory Visit
Admission: RE | Admit: 2021-09-21 | Discharge: 2021-09-21 | Disposition: A | Discharge status: Home / Self Care | Payer: Medicaid Other | Source: Ambulatory Visit | Attending: Internal Medicine | Admitting: Internal Medicine

## 2021-09-21 VITALS — BP 108/73 | HR 75 | Temp 98.8°F | Resp 18 | Ht 63.0 in | Wt 110.0 lb

## 2021-09-21 DIAGNOSIS — Z113 Encounter for screening for infections with a predominantly sexual mode of transmission: Secondary | ICD-10-CM

## 2021-09-21 DIAGNOSIS — N3001 Acute cystitis with hematuria: Secondary | ICD-10-CM

## 2021-09-21 DIAGNOSIS — Z3202 Encounter for pregnancy test, result negative: Secondary | ICD-10-CM

## 2021-09-21 DIAGNOSIS — R8290 Unspecified abnormal findings in urine: Secondary | ICD-10-CM | POA: Insufficient documentation

## 2021-09-21 LAB — POCT URINALYSIS DIP (MANUAL ENTRY)
Bilirubin, UA: NEGATIVE
Glucose, UA: NEGATIVE mg/dL
Ketones, POC UA: NEGATIVE mg/dL
Nitrite, UA: NEGATIVE
Protein Ur, POC: 30 mg/dL — AB
Spec Grav, UA: 1.025 (ref 1.010–1.025)
Urobilinogen, UA: 0.2 E.U./dL
pH, UA: 6.5 (ref 5.0–8.0)

## 2021-09-21 LAB — POCT URINE PREGNANCY: Preg Test, Ur: NEGATIVE

## 2021-09-21 MED ORDER — NITROFURANTOIN MONOHYD MACRO 100 MG PO CAPS: 100.0000 mg | ORAL_CAPSULE | Freq: Two times a day (BID) | ORAL | 0 refills | Status: DC

## 2021-09-21 NOTE — ED Triage Notes (Signed)
Patient requesting STI testing, no abnormal vaginal discharge, low back pain, no dysuria, requesting pregnancy testing. ?

## 2021-09-21 NOTE — Discharge Instructions (Signed)
You have a urinary tract infection so you are being treated with antibiotic.  STD testing is pending as well as urine culture.  Please refrain from sexual activity until test results and treatment are complete.  We will call if results are positive. ?

## 2021-09-21 NOTE — ED Provider Notes (Signed)
?[name] ? ? ? ?CSN: [id number] ?Arrival date & time: 09/21/21  1702 ? ? ?  ? ?History   ?Chief Complaint ?Chief Complaint  ?Patient presents with  ? Exposure to STD  ?  Entered by patient  ? Appointment  ? ? ?HPI ?Patricia Sullivan is a 21 y.o. female.  ? ?Patient presents for routine STD testing.  Patient denies known exposure to STD but reports that she has been having unprotected sexual intercourse and wishes to have STD test completed including blood work for HIV and syphilis.  Patient does report some malodorous urine but denies urinary burning, back pain, fever, vaginal discharge, hematuria, irregular vaginal bleeding, vaginal lesions.  Patient is requesting pregnancy test.  Last menstrual cycle was approximately 2 to 3 weeks ago. ? ? ?Exposure to STD ? ? ?Past Medical History:  ?Diagnosis Date  ? Horseshoe kidney   ? ? ?Patient Active Problem List  ? Diagnosis Date Noted  ? Adjustment disorder with mixed anxiety and depressed mood 05/05/2020  ? ? ?History reviewed. No pertinent surgical history. ? ?OB History   ?No obstetric history on file. ?  ? ? ? ?Home Medications   ? ?Prior to Admission medications   ?Medication Sig Start Date End Date Taking? Authorizing Provider  ?nitrofurantoin, macrocrystal-monohydrate, (MACROBID) 100 MG capsule Take 1 capsule (100 mg total) by mouth 2 (two) times daily. 09/21/21  Yes , Acie Fredrickson, FNP  ?dicyclomine (BENTYL) 20 MG tablet Take 1 tablet (20 mg total) by mouth 2 (two) times daily as needed for up to 20 doses for spasms. 06/04/21   Terald Sleeper, MD  ?metroNIDAZOLE (FLAGYL) 500 MG tablet Take 1 tablet (500 mg total) by mouth 2 (two) times daily. 05/19/21   Lamptey, Britta Mccreedy, MD  ?Mouthwash Compounding Base (MOUTH WASH-GP) LIQD Swish and spit 10 mLs as needed. Compounding solution;Maalox; 75ml,Lidocaine viscous 2%:80 ml; Benadryl 12.5mg /15ml; 80 ml 07/22/20   Lamptey, Britta Mccreedy, MD  ?naproxen (NAPROSYN) 375 MG tablet Take 1 tablet (375 mg total) by mouth 2  (two) times daily. 09/21/20   Linwood Dibbles, MD  ?ondansetron (ZOFRAN ODT) 8 MG disintegrating tablet Take 1 tablet (8 mg total) by mouth every 8 (eight) hours as needed for nausea or vomiting. 09/21/20   Linwood Dibbles, MD  ?Vitamin D, Ergocalciferol, (DRISDOL) 1.25 MG (50000 UNIT) CAPS capsule Take 50,000 Units by mouth once a week. 01/25/21   [provider]  ? ? ?Family History ?Family History  ?Problem Relation Age of Onset  ? Healthy Mother   ? Healthy Father   ? ? ?Social History ?Social History  ? ?Tobacco Use  ? Smoking status: Never  ? Smokeless tobacco: Never  ?Substance Use Topics  ? Alcohol use: Yes  ?  Comment: Occasionally   ? Drug use: Yes  ?  Types: Marijuana  ?  Comment: Occasionally  ? ? ? ?Allergies   ?Patient has no known allergies. ? ? ?Review of Systems ?Review of Systems ?Per HPI ? ?Physical Exam ?Triage Vital Signs ?ED Triage Vitals  ?Enc Vitals Group  ?   BP 09/21/21 1715 108/73  ?   Pulse Rate 09/21/21 1715 75  ?   Resp 09/21/21 1715 18  ?   Temp 09/21/21 1715 98.8 ?F (37.1 ?C)  ?   Temp Source 09/21/21 1715 Oral  ?   SpO2 09/21/21 1715 98 %  ?   Weight 09/21/21 1717 110 lb (49.9 kg)  ?   Height 09/21/21 1717 5\' 3"  (  1.6 m)  ?   Head Circumference --   ?   Peak Flow --   ?   Pain Score 09/21/21 1717 0  ?   Pain Loc --   ?   Pain Edu? --   ?   Excl. in GC? --   ? ?No data found. ? ?Updated Vital Signs ?BP 108/73 (BP Location: Left Arm)   Pulse 75   Temp 98.8 ?F (37.1 ?C) (Oral)   Resp 18   Ht 5\' 3"  (1.6 m)   Wt 110 lb (49.9 kg)   LMP 09/07/2021   SpO2 98%   BMI 19.49 kg/m?  ? ?Visual Acuity ?Right Eye Distance:   ?Left Eye Distance:   ?Bilateral Distance:   ? ?Right Eye Near:   ?Left Eye Near:    ?Bilateral Near:    ? ?Physical Exam ?Constitutional:   ?   General: She is not in acute distress. ?   Appearance: Normal appearance. She is not toxic-appearing or diaphoretic.  ?HENT:  ?   Head: Normocephalic and atraumatic.  ?Eyes:  ?   Extraocular Movements: Extraocular movements intact.   ?   Conjunctiva/sclera: Conjunctivae normal.  ?Cardiovascular:  ?   Rate and Rhythm: Normal rate and regular rhythm.  ?   Pulses: Normal pulses.  ?   Heart sounds: Normal heart sounds.  ?Pulmonary:  ?   Effort: Pulmonary effort is normal. No respiratory distress.  ?   Breath sounds: Normal breath sounds.  ?Abdominal:  ?   General: Abdomen is flat. Bowel sounds are normal. There is no distension.  ?   Palpations: Abdomen is soft.  ?   Tenderness: There is no abdominal tenderness.  ?Genitourinary: ?   Comments: Deferred with shared decision making.  Self swab performed. ?Neurological:  ?   General: No focal deficit present.  ?   Mental Status: She is alert and oriented to person, place, and time. Mental status is at baseline.  ?Psychiatric:     ?   Mood and Affect: Mood normal.     ?   Behavior: Behavior normal.     ?   Thought Content: Thought content normal.     ?   Judgment: Judgment normal.  ? ? ? ?UC Treatments / Results  ?Labs ?(all labs ordered are listed, but only abnormal results are displayed) ?Labs Reviewed  ?POCT URINALYSIS DIP (MANUAL ENTRY) - Abnormal; Notable for the following components:  ?    Result Value  ? Clarity, UA cloudy (*)   ? Blood, UA trace-intact (*)   ? Protein Ur, POC =30 (*)   ? Leukocytes, UA Small (1+) (*)   ? All other components within normal limits  ?URINE CULTURE  ?HIV ANTIBODY (ROUTINE TESTING W REFLEX)  ?RPR  ?POCT URINE PREGNANCY  ?CERVICOVAGINAL ANCILLARY ONLY  ? ? ?EKG ? ? ?Radiology ?No results found. ? ?Procedures ?Procedures (including critical care time) ? ?Medications Ordered in UC ?Medications - No data to display ? ?Initial Impression / Assessment and Plan / UC Course  ?I have reviewed the triage vital signs and the nursing notes. ? ?Pertinent labs & imaging results that were available during my care of the patient were reviewed by me and considered in my medical decision making (see chart for details). ? ?  ? ?Urinalysis indicating urinary tract infection.  Will treat  with Macrobid.  Urine culture pending.  Urine pregnancy negative.  Cervicovaginal swab and blood work for HIV and syphilis pending.  Patient  to refrain from sexual activity until test results and treatment are complete.  Will add or change treatment once results are complete.  Discussed return precautions.  Patient verbalized understanding and was agreeable with plan. ?Final Clinical Impressions(s) / UC Diagnoses  ? ?Final diagnoses:  ?Acute cystitis with hematuria  ?Malodorous urine  ?Screening examination for venereal disease  ?Urine pregnancy test negative  ? ? ? ?Discharge Instructions   ? ?  ?You have a urinary tract infection so you are being treated with antibiotic.  STD testing is pending as well as urine culture.  Please refrain from sexual activity until test results and treatment are complete.  We will call if results are positive. ? ? ? ?ED Prescriptions   ? ? Medication Sig Dispense Auth. Provider  ? nitrofurantoin, macrocrystal-monohydrate, (MACROBID) 100 MG capsule Take 1 capsule (100 mg total) by mouth 2 (two) times daily. 10 capsule Ervin KnackMound, Nikie Cid E, [place]  ? ?  ? ?PDMP not reviewed this encounter. ?  ?Gustavus BryantMound, Deandrae Wajda E, [place]NP ?09/21/21 1742 ? ?

## 2021-09-22 LAB — CERVICOVAGINAL ANCILLARY ONLY
Bacterial Vaginitis (gardnerella): POSITIVE — AB
Candida Glabrata: NEGATIVE
Candida Vaginitis: NEGATIVE
Chlamydia: NEGATIVE
Comment: NEGATIVE
Comment: NEGATIVE
Comment: NEGATIVE
Comment: NEGATIVE
Comment: NEGATIVE
Comment: NORMAL
Neisseria Gonorrhea: NEGATIVE
Trichomonas: NEGATIVE

## 2021-09-22 LAB — HIV ANTIBODY (ROUTINE TESTING W REFLEX): HIV Screen 4th Generation wRfx: NONREACTIVE

## 2021-09-22 LAB — RPR: RPR Ser Ql: NONREACTIVE

## 2021-09-23 ENCOUNTER — Telehealth (HOSPITAL_COMMUNITY): Payer: Self-pay | Admitting: Emergency Medicine

## 2021-09-23 LAB — URINE CULTURE: Culture: >=100000 — AB

## 2021-09-23 MED ORDER — METRONIDAZOLE 500 MG PO TABS: 500.0000 mg | ORAL_TABLET | Freq: Two times a day (BID) | ORAL | 0 refills | Status: DC

## 2021-09-23 MED ORDER — CEPHALEXIN 500 MG PO CAPS: 500.0000 mg | ORAL_CAPSULE | Freq: Two times a day (BID) | ORAL | 0 refills | Status: AC

## 2022-01-24 ENCOUNTER — Ambulatory Visit
Admission: RE | Admit: 2022-01-24 | Discharge: 2022-01-24 | Disposition: A | Discharge status: Home / Self Care | Payer: Medicaid Other | Source: Ambulatory Visit | Attending: Internal Medicine | Admitting: Internal Medicine

## 2022-01-24 VITALS — BP 113/79 | HR 90 | Temp 99.0°F | Resp 18

## 2022-01-24 DIAGNOSIS — R053 Chronic cough: Secondary | ICD-10-CM | POA: Diagnosis present

## 2022-01-24 DIAGNOSIS — J069 Acute upper respiratory infection, unspecified: Secondary | ICD-10-CM | POA: Diagnosis present

## 2022-01-24 DIAGNOSIS — Z113 Encounter for screening for infections with a predominantly sexual mode of transmission: Secondary | ICD-10-CM | POA: Insufficient documentation

## 2022-01-24 DIAGNOSIS — Z202 Contact with and (suspected) exposure to infections with a predominantly sexual mode of transmission: Secondary | ICD-10-CM | POA: Diagnosis not present

## 2022-01-24 DIAGNOSIS — J029 Acute pharyngitis, unspecified: Secondary | ICD-10-CM | POA: Diagnosis present

## 2022-01-24 LAB — POCT URINE PREGNANCY: Preg Test, Ur: NEGATIVE

## 2022-01-24 LAB — POCT RAPID STREP A (OFFICE): Rapid Strep A Screen: NEGATIVE

## 2022-01-24 MED ORDER — CEFTRIAXONE SODIUM 500 MG IJ SOLR
500.0000 mg | Freq: Once | INTRAMUSCULAR | Status: AC
  Administered 2022-01-24: 500 mg via INTRAMUSCULAR

## 2022-01-24 MED ORDER — AZITHROMYCIN 250 MG PO TABS
ORAL_TABLET | ORAL | 0 refills | Status: AC
Start: 2022-01-24 — End: 2022-01-29

## 2022-01-24 MED ORDER — BENZONATATE 100 MG PO CAPS: 100.0000 mg | ORAL_CAPSULE | Freq: Three times a day (TID) | ORAL | 0 refills | Status: DC | PRN

## 2022-01-24 MED ORDER — FLUTICASONE PROPIONATE 50 MCG/ACT NA SUSP: 1.0000 | Freq: Every day | NASAL | 0 refills | Status: DC

## 2022-01-24 NOTE — ED Triage Notes (Addendum)
Pt reports to uc with concern for sti exposure. No current symptoms. St recent partner had symptoms and got a shot has not received his results yet. Pt reports mild sore throat for 2 weeks and is concerned for sti as well in throat Mild fevers.

## 2022-01-24 NOTE — Discharge Instructions (Signed)
You have been prescribed antibiotic, cough medication, nasal spray for your upper respiratory infection.  You were given an antibiotic injection today for suspicion of STD as well.  Your vaginal swab and blood work for STD testing is pending.  We will call if it is positive and treat as appropriate.  Please refrain from sexual activity until test results and treatment are complete.

## 2022-01-24 NOTE — ED Provider Notes (Signed)
[name]    CSN: [id number] Arrival date & time: 01/24/22  0844      History   Chief Complaint Chief Complaint  Patient presents with   Exposure to STD    Entered by patient    HPI Patricia Sullivan is a 21 y.o. female.   Patient presents with several different chief complaints today.  Patient reports that she has had runny nose, sore throat, cough that has been present for about 2 weeks.  She states that her mother had similar symptoms as well.  Denies any known fevers.  Denies chest pain, shortness of breath, ear pain, nausea, vomiting, diarrhea, abdominal pain.  Patient does not report taking medications to help alleviate symptoms.  She also reports that she would like STD testing as her sexual partner recently got testing for STDs and had to receive "a shot" for treatment.  Patient is not sure what STD he has.  She denies any associated symptoms including vaginal discharge, dysuria, urinary frequency, hematuria, abdominal pain, pelvic pain, back pain, fever.  Last menstrual cycle was 01/03/2022.  Patient is not concerned that sore throat could be related to STD.   Exposure to STD    Past Medical History:  Diagnosis Date   Horseshoe kidney     Patient Active Problem List   Diagnosis Date Noted   Adjustment disorder with mixed anxiety and depressed mood 05/05/2020    No past surgical history on file.  OB History   No obstetric history on file.      Home Medications    Prior to Admission medications   Medication Sig Start Date End Date Taking? Authorizing Provider  azithromycin (ZITHROMAX Z-PAK) 250 MG tablet Take 2 tablets (500 mg total) by mouth daily for 1 day, THEN 1 tablet (250 mg total) daily for 4 days. 01/24/22 01/29/22 Yes , Michele Rockers, FNP  benzonatate (TESSALON) 100 MG capsule Take 1 capsule (100 mg total) by mouth every 8 (eight) hours as needed for cough. 01/24/22  Yes , Hildred Alamin E, FNP  fluticasone (FLONASE) 50 MCG/ACT nasal spray Place  1 spray into both nostrils daily for 3 days. 01/24/22 01/27/22 Yes , Michele Rockers, FNP  dicyclomine (BENTYL) 20 MG tablet Take 1 tablet (20 mg total) by mouth 2 (two) times daily as needed for up to 20 doses for spasms. 06/04/21   Wyvonnia Dusky, MD  metroNIDAZOLE (FLAGYL) 500 MG tablet Take 1 tablet (500 mg total) by mouth 2 (two) times daily. 09/23/21   Lamptey, Myrene Galas, MD  Mouthwash Compounding Base (MOUTH WASH-GP) LIQD Swish and spit 10 mLs as needed. Compounding solution;Maalox; 33ml,Lidocaine viscous 2%:80 ml; Benadryl 12.5mg /75ml; 80 ml 07/22/20   Lamptey, Myrene Galas, MD  naproxen (NAPROSYN) 375 MG tablet Take 1 tablet (375 mg total) by mouth 2 (two) times daily. 09/21/20   Dorie Rank, MD  ondansetron (ZOFRAN ODT) 8 MG disintegrating tablet Take 1 tablet (8 mg total) by mouth every 8 (eight) hours as needed for nausea or vomiting. 09/21/20   Dorie Rank, MD  Vitamin D, Ergocalciferol, (DRISDOL) 1.25 MG (50000 UNIT) CAPS capsule Take 50,000 Units by mouth once a week. 01/25/21   [provider]    Family History Family History  Problem Relation Age of Onset   Healthy Mother    Healthy Father     Social History Social History   Tobacco Use   Smoking status: Never   Smokeless tobacco: Never  Substance Use Topics   Alcohol use: Yes  Comment: Occasionally    Drug use: Yes    Types: Marijuana    Comment: Occasionally     Allergies   Patient has no known allergies.   Review of Systems Review of Systems Per HPI  Physical Exam Triage Vital Signs ED Triage Vitals  Enc Vitals Group     BP 01/24/22 0905 113/79     Pulse Rate 01/24/22 0903 90     Resp 01/24/22 0903 18     Temp 01/24/22 0903 99 F (37.2 C)     Temp src --      SpO2 01/24/22 0903 97 %     Weight --      Height --      Head Circumference --      Peak Flow --      Pain Score 01/24/22 0908 6     Pain Loc --      Pain Edu? --      Excl. in GC? --    No data found.  Updated Vital Signs BP 113/79    Pulse 90   Temp 99 F (37.2 C)   Resp 18   LMP  (LMP Unknown)   SpO2 97%   Visual Acuity Right Eye Distance:   Left Eye Distance:   Bilateral Distance:    Right Eye Near:   Left Eye Near:    Bilateral Near:     Physical Exam Constitutional:      General: She is not in acute distress.    Appearance: Normal appearance. She is not toxic-appearing or diaphoretic.  HENT:     Head: Normocephalic and atraumatic.     Right Ear: Tympanic membrane and ear canal normal.     Left Ear: Tympanic membrane and ear canal normal.     Nose: Congestion present.     Mouth/Throat:     Mouth: Mucous membranes are moist.     Pharynx: No posterior oropharyngeal erythema.  Eyes:     Extraocular Movements: Extraocular movements intact.     Conjunctiva/sclera: Conjunctivae normal.     Pupils: Pupils are equal, round, and reactive to light.  Cardiovascular:     Rate and Rhythm: Normal rate and regular rhythm.     Pulses: Normal pulses.     Heart sounds: Normal heart sounds.  Pulmonary:     Effort: Pulmonary effort is normal. No respiratory distress.     Breath sounds: Normal breath sounds. No stridor. No wheezing, rhonchi or rales.  Abdominal:     General: Abdomen is flat. Bowel sounds are normal.     Palpations: Abdomen is soft.  Genitourinary:    Comments: Deferred with shared decision making.  Self swab performed. Musculoskeletal:        General: Normal range of motion.     Cervical back: Normal range of motion.  Skin:    General: Skin is warm and dry.  Neurological:     General: No focal deficit present.     Mental Status: She is alert and oriented to person, place, and time. Mental status is at baseline.  Psychiatric:        Mood and Affect: Mood normal.        Behavior: Behavior normal.      UC Treatments / Results  Labs (all labs ordered are listed, but only abnormal results are displayed) Labs Reviewed  CULTURE, GROUP A STREP ([facility])  RPR  HIV ANTIBODY (ROUTINE TESTING W  REFLEX)  POCT RAPID STREP A (OFFICE)  POCT  URINE PREGNANCY  CERVICOVAGINAL ANCILLARY ONLY    EKG   Radiology No results found.  Procedures Procedures (including critical care time)  Medications Ordered in UC Medications  cefTRIAXone (ROCEPHIN) injection 500 mg (500 mg Intramuscular Given 01/24/22 0946)    Initial Impression / Assessment and Plan / UC Course  I have reviewed the triage vital signs and the nursing notes.  Pertinent labs & imaging results that were available during my care of the patient were reviewed by me and considered in my medical decision making (see chart for details).     Patient has acute upper respiratory infection.  Suspicious that viral bronchitis could be present.  Although, given duration of upper respiratory symptoms will opt to treat with azithromycin to cover for atypicals.  Do not think chest imaging is necessary given no adventitious lung sounds on exam.  Do not think viral testing is necessary given duration of symptoms as well.  Vaginal swab pending per patient's report of possible STD exposure.  Given patient's report that her partner had to receive "a shot", I am suspicious of gonorrhea exposure.  Will opt to treat with IM Rocephin.  Will await results for any further treatment.  HIV and RPR pending per patient request as well.  Patient was advised of safe sex practices and to avoid sexual activity until test results and treatment are complete.  Discussed return precautions for all chief complaints today.  Patient verbalized understanding and was agreeable with plan.  Final Clinical Impressions(s) / UC Diagnoses   Final diagnoses:  Screening examination for venereal disease  Persistent cough  Acute upper respiratory infection  Sore throat     Discharge Instructions      You have been prescribed antibiotic, cough medication, nasal spray for your upper respiratory infection.  You were given an antibiotic injection today for suspicion of  STD as well.  Your vaginal swab and blood work for STD testing is pending.  We will call if it is positive and treat as appropriate.  Please refrain from sexual activity until test results and treatment are complete.    ED Prescriptions     Medication Sig Dispense Auth. Provider   azithromycin (ZITHROMAX Z-PAK) 250 MG tablet Take 2 tablets (500 mg total) by mouth daily for 1 day, THEN 1 tablet (250 mg total) daily for 4 days. 6 tablet Carlton, [place] E, [place]   benzonatate (TESSALON) 100 MG capsule Take 1 capsule (100 mg total) by mouth every 8 (eight) hours as needed for cough. 21 capsule Roland, Glendora E, [place]   fluticasone [facility]) 50 MCG/ACT nasal spray Place 1 spray into both nostrils daily for 3 days. 16 g Gustavus Bryant, [place]      PDMP not reviewed this encounter.   Gustavus Bryant, [place] 01/24/22 [phone]

## 2022-01-25 LAB — CERVICOVAGINAL ANCILLARY ONLY
Bacterial Vaginitis (gardnerella): POSITIVE — AB
Candida Glabrata: NEGATIVE
Candida Vaginitis: POSITIVE — AB
Chlamydia: NEGATIVE
Comment: NEGATIVE
Comment: NEGATIVE
Comment: NEGATIVE
Comment: NEGATIVE
Comment: NEGATIVE
Comment: NORMAL
Neisseria Gonorrhea: POSITIVE — AB
Trichomonas: NEGATIVE

## 2022-01-25 LAB — RPR: RPR Ser Ql: NONREACTIVE

## 2022-01-25 LAB — HIV ANTIBODY (ROUTINE TESTING W REFLEX): HIV Screen 4th Generation wRfx: NONREACTIVE

## 2022-01-26 ENCOUNTER — Telehealth (HOSPITAL_COMMUNITY): Payer: Self-pay | Admitting: Emergency Medicine

## 2022-01-26 MED ORDER — METRONIDAZOLE 500 MG PO TABS: 500.0000 mg | ORAL_TABLET | Freq: Two times a day (BID) | ORAL | 0 refills | Status: DC

## 2022-01-26 MED ORDER — FLUCONAZOLE 150 MG PO TABS: 150.0000 mg | ORAL_TABLET | Freq: Once | ORAL | 0 refills | Status: AC

## 2022-01-27 LAB — CULTURE, GROUP A STREP (THRC)

## 2022-02-17 ENCOUNTER — Ambulatory Visit
Admission: RE | Admit: 2022-02-17 | Discharge: 2022-02-17 | Disposition: A | Discharge status: Home / Self Care | Payer: Medicaid Other | Source: Ambulatory Visit | Attending: Internal Medicine | Admitting: Internal Medicine

## 2022-02-17 ENCOUNTER — Other Ambulatory Visit: Payer: Self-pay

## 2022-02-17 VITALS — BP 106/70 | HR 90 | Temp 98.7°F | Resp 20

## 2022-02-17 DIAGNOSIS — N898 Other specified noninflammatory disorders of vagina: Secondary | ICD-10-CM

## 2022-02-17 DIAGNOSIS — Z113 Encounter for screening for infections with a predominantly sexual mode of transmission: Secondary | ICD-10-CM | POA: Diagnosis present

## 2022-02-17 LAB — POCT URINE PREGNANCY: Preg Test, Ur: NEGATIVE

## 2022-02-17 NOTE — Discharge Instructions (Signed)
Vaginal swab is pending.  We will call if it is positive and treat as appropriate.  Please refrain from sexual activity until test results and treatment are complete. ?

## 2022-02-17 NOTE — ED Provider Notes (Addendum)
[name]    CSN: [id number] Arrival date & time: 02/17/22  1800      History   Chief Complaint Chief Complaint  Patient presents with   Vaginal Itching    Entered by patient    HPI Patricia Sullivan is a 21 y.o. female.   Patient presents with white to brown vaginal discharge and vaginal irritation that started a few days prior.  Denies any confirmed exposure to STD but has had a new sexual partner.  Denies any associated urinary symptoms, abnormal vaginal bleeding, abdominal pain, back pain, fever.  Patient reports that she had leftover Diflucan so she took it yesterday with no improvement.  Denies chance of pregnancy as menstrual cycle was about 3 weeks prior and is supposed to start in 6 days.  Patient was seen a few weeks prior and tested positive for gonorrhea, BV, yeast but reports that  those symptoms resolved, and these are new symptoms.   Vaginal Itching    Past Medical History:  Diagnosis Date   Horseshoe kidney     Patient Active Problem List   Diagnosis Date Noted   Adjustment disorder with mixed anxiety and depressed mood 05/05/2020    History reviewed. No pertinent surgical history.  OB History   No obstetric history on file.      Home Medications    Prior to Admission medications   Medication Sig Start Date End Date Taking? Authorizing Provider  benzonatate (TESSALON) 100 MG capsule Take 1 capsule (100 mg total) by mouth every 8 (eight) hours as needed for cough. 01/24/22   Gustavus Bryant, FNP  dicyclomine (BENTYL) 20 MG tablet Take 1 tablet (20 mg total) by mouth 2 (two) times daily as needed for up to 20 doses for spasms. 06/04/21   Terald Sleeper, MD  fluticasone (FLONASE) 50 MCG/ACT nasal spray Place 1 spray into both nostrils daily for 3 days. 01/24/22 01/27/22  Gustavus Bryant, FNP  metroNIDAZOLE (FLAGYL) 500 MG tablet Take 1 tablet (500 mg total) by mouth 2 (two) times daily. Patient not taking: Reported on 02/17/2022 01/26/22    Merrilee Jansky, MD  Mouthwash Compounding Base (MOUTH WASH-GP) LIQD Swish and spit 10 mLs as needed. Compounding solution;Maalox; 74ml,Lidocaine viscous 2%:80 ml; Benadryl 12.5mg /80ml; 80 ml 07/22/20   Lamptey, Britta Mccreedy, MD  naproxen (NAPROSYN) 375 MG tablet Take 1 tablet (375 mg total) by mouth 2 (two) times daily. 09/21/20   Linwood Dibbles, MD  ondansetron (ZOFRAN ODT) 8 MG disintegrating tablet Take 1 tablet (8 mg total) by mouth every 8 (eight) hours as needed for nausea or vomiting. 09/21/20   Linwood Dibbles, MD  Vitamin D, Ergocalciferol, (DRISDOL) 1.25 MG (50000 UNIT) CAPS capsule Take 50,000 Units by mouth once a week. 01/25/21   [provider]    Family History Family History  Problem Relation Age of Onset   Healthy Mother    Healthy Father     Social History Social History   Tobacco Use   Smoking status: Never   Smokeless tobacco: Never  Substance Use Topics   Alcohol use: Yes    Comment: Occasionally    Drug use: Yes    Types: Marijuana    Comment: Occasionally     Allergies   Patient has no known allergies.   Review of Systems Review of Systems Per HPI  Physical Exam Triage Vital Signs ED Triage Vitals  Enc Vitals Group     BP 02/17/22 1813 106/70  Pulse Rate 02/17/22 1813 90     Resp 02/17/22 1813 20     Temp 02/17/22 1813 98.7 F (37.1 C)     Temp Source 02/17/22 1813 Oral     SpO2 02/17/22 1813 99 %     Weight --      Height --      Head Circumference --      Peak Flow --      Pain Score 02/17/22 1815 0     Pain Loc --      Pain Edu? --      Excl. in GC? --    No data found.  Updated Vital Signs BP 106/70 (BP Location: Left Arm)   Pulse 90   Temp 98.7 F (37.1 C) (Oral)   Resp 20   LMP  (LMP Unknown)   SpO2 99%   Visual Acuity Right Eye Distance:   Left Eye Distance:   Bilateral Distance:    Right Eye Near:   Left Eye Near:    Bilateral Near:     Physical Exam Constitutional:      General: She is not in acute distress.     Appearance: Normal appearance. She is not toxic-appearing or diaphoretic.  HENT:     Head: Normocephalic and atraumatic.  Eyes:     Extraocular Movements: Extraocular movements intact.     Conjunctiva/sclera: Conjunctivae normal.  Pulmonary:     Effort: Pulmonary effort is normal.  Genitourinary:    Comments: Deferred with shared decision-making.  Self swab performed. Neurological:     General: No focal deficit present.     Mental Status: She is alert and oriented to person, place, and time. Mental status is at baseline.  Psychiatric:        Mood and Affect: Mood normal.        Behavior: Behavior normal.        Thought Content: Thought content normal.        Judgment: Judgment normal.      UC Treatments / Results  Labs (all labs ordered are listed, but only abnormal results are displayed) Labs Reviewed  POCT URINE PREGNANCY  CERVICOVAGINAL ANCILLARY ONLY    EKG   Radiology No results found.  Procedures Procedures (including critical care time)  Medications Ordered in UC Medications - No data to display  Initial Impression / Assessment and Plan / UC Course  I have reviewed the triage vital signs and the nursing notes.  Pertinent labs & imaging results that were available during my care of the patient were reviewed by me and considered in my medical decision making (see chart for details).     cervicovaginal swab pending.  Will await results before treatment given Diflucan was not beneficial for patient.  UA also deferred given no associated urinary symptoms.  Advised safe sex practices and refraining from sexual activity until test results and treatment are complete.  Discussed return precautions.  Patient verbalized understanding and was agreeable with plan. Final Clinical Impressions(s) / UC Diagnoses   Final diagnoses:  Vaginal discharge  Screening examination for venereal disease     Discharge Instructions      Vaginal swab is pending.  We will call  if it is positive and treat as appropriate.  Please refrain from sexual activity until test results and treatment are complete.    ED Prescriptions   None    PDMP not reviewed this encounter.   Gustavus Bryant, [place] 02/17/22 1830    Holley,  Acie Fredrickson, FNP 02/17/22 1831

## 2022-02-17 NOTE — ED Triage Notes (Signed)
Pt here for vaginal discharge and irritation; pt sts she just took a diflucan

## 2022-02-21 ENCOUNTER — Telehealth (HOSPITAL_COMMUNITY): Payer: Self-pay | Admitting: Emergency Medicine

## 2022-02-21 LAB — CERVICOVAGINAL ANCILLARY ONLY
Bacterial Vaginitis (gardnerella): POSITIVE — AB
Candida Glabrata: NEGATIVE
Candida Vaginitis: POSITIVE — AB
Chlamydia: NEGATIVE
Comment: NEGATIVE
Comment: NEGATIVE
Comment: NEGATIVE
Comment: NEGATIVE
Comment: NEGATIVE
Comment: NORMAL
Neisseria Gonorrhea: NEGATIVE
Trichomonas: NEGATIVE

## 2022-02-21 MED ORDER — FLUCONAZOLE 150 MG PO TABS: 150.0000 mg | ORAL_TABLET | Freq: Once | ORAL | 0 refills | Status: AC

## 2022-02-21 MED ORDER — METRONIDAZOLE 500 MG PO TABS: 500.0000 mg | ORAL_TABLET | Freq: Two times a day (BID) | ORAL | 0 refills | Status: DC

## 2022-03-11 ENCOUNTER — Ambulatory Visit
Admission: RE | Admit: 2022-03-11 | Discharge: 2022-03-11 | Disposition: A | Discharge status: Home / Self Care | Payer: Medicaid Other | Source: Ambulatory Visit | Attending: Internal Medicine | Admitting: Internal Medicine

## 2022-03-11 VITALS — BP 117/68 | HR 102 | Temp 98.3°F | Resp 16

## 2022-03-11 DIAGNOSIS — N898 Other specified noninflammatory disorders of vagina: Secondary | ICD-10-CM | POA: Insufficient documentation

## 2022-03-11 DIAGNOSIS — Z113 Encounter for screening for infections with a predominantly sexual mode of transmission: Secondary | ICD-10-CM | POA: Insufficient documentation

## 2022-03-11 LAB — POCT URINE PREGNANCY: Preg Test, Ur: NEGATIVE

## 2022-03-11 MED ORDER — CEFTRIAXONE SODIUM 500 MG IJ SOLR
500.0000 mg | Freq: Once | INTRAMUSCULAR | Status: AC
  Administered 2022-03-11: 500 mg via INTRAMUSCULAR

## 2022-03-11 NOTE — ED Provider Notes (Signed)
[name]    CSN: [id number] Arrival date & time: 03/11/22  1818      History   Chief Complaint Chief Complaint  Patient presents with   Vaginal Discharge    Entered by patient   Exposure to STD    HPI Patricia Sullivan is a 21 y.o. female.   Patient presents due to concerns of STD exposure.  Patient reports that she thinks that she may have been exposed to gonorrhea as she had sexual intercourse again with same sexual partner who gave her gonorrhea previously.  She states that she thought that he was treated and tested but is not exactly sure.  She reports that she has vaginal discharge but it appears "normal".  Denies any associated dysuria, urinary frequency, abnormal vaginal bleeding, hematuria, abdominal pain, pelvic pain, back pain, fever.  Last menstrual cycle was 02/22/2022.  Requesting HIV and syphilis blood work as well.   Vaginal Discharge Exposure to STD    Past Medical History:  Diagnosis Date   Horseshoe kidney     Patient Active Problem List   Diagnosis Date Noted   Adjustment disorder with mixed anxiety and depressed mood 05/05/2020    History reviewed. No pertinent surgical history.  OB History   No obstetric history on file.      Home Medications    Prior to Admission medications   Medication Sig Start Date End Date Taking? Authorizing Provider  benzonatate (TESSALON) 100 MG capsule Take 1 capsule (100 mg total) by mouth every 8 (eight) hours as needed for cough. 01/24/22   Gustavus Bryant, FNP  dicyclomine (BENTYL) 20 MG tablet Take 1 tablet (20 mg total) by mouth 2 (two) times daily as needed for up to 20 doses for spasms. 06/04/21   Terald Sleeper, MD  fluticasone (FLONASE) 50 MCG/ACT nasal spray Place 1 spray into both nostrils daily for 3 days. 01/24/22 01/27/22  Gustavus Bryant, FNP  metroNIDAZOLE (FLAGYL) 500 MG tablet Take 1 tablet (500 mg total) by mouth 2 (two) times daily. 02/21/22   Lamptey, Britta Mccreedy, MD  Mouthwash  Compounding Base (MOUTH WASH-GP) LIQD Swish and spit 10 mLs as needed. Compounding solution;Maalox; 63ml,Lidocaine viscous 2%:80 ml; Benadryl 12.5mg /44ml; 80 ml 07/22/20   Lamptey, Britta Mccreedy, MD  naproxen (NAPROSYN) 375 MG tablet Take 1 tablet (375 mg total) by mouth 2 (two) times daily. 09/21/20   Linwood Dibbles, MD  ondansetron (ZOFRAN ODT) 8 MG disintegrating tablet Take 1 tablet (8 mg total) by mouth every 8 (eight) hours as needed for nausea or vomiting. 09/21/20   Linwood Dibbles, MD  Vitamin D, Ergocalciferol, (DRISDOL) 1.25 MG (50000 UNIT) CAPS capsule Take 50,000 Units by mouth once a week. 01/25/21   [provider]    Family History Family History  Problem Relation Age of Onset   Healthy Mother    Healthy Father     Social History Social History   Tobacco Use   Smoking status: Never   Smokeless tobacco: Never  Substance Use Topics   Alcohol use: Yes    Comment: Occasionally    Drug use: Yes    Types: Marijuana    Comment: Occasionally     Allergies   Patient has no known allergies.   Review of Systems Review of Systems Per HPI  Physical Exam Triage Vital Signs ED Triage Vitals  Enc Vitals Group     BP 03/11/22 1829 117/68     Pulse Rate 03/11/22 1829 (!) 102  Resp 03/11/22 1829 16     Temp 03/11/22 1829 98.3 F (36.8 C)     Temp Source 03/11/22 1829 Oral     SpO2 03/11/22 1829 98 %     Weight --      Height --      Head Circumference --      Peak Flow --      Pain Score 03/11/22 1831 0     Pain Loc --      Pain Edu? --      Excl. in [place]? --    No data found.  Updated Vital Signs BP 117/68 (BP Location: Left Arm)   Pulse (!) 102   Temp 98.3 F (36.8 C) (Oral)   Resp 16   LMP 02/25/2022 (Approximate)   SpO2 98%   Visual Acuity Right Eye Distance:   Left Eye Distance:   Bilateral Distance:    Right Eye Near:   Left Eye Near:    Bilateral Near:     Physical Exam Constitutional:      General: She is not in acute distress.    Appearance:  Normal appearance. She is not toxic-appearing or diaphoretic.  HENT:     Head: Normocephalic and atraumatic.  Eyes:     Extraocular Movements: Extraocular movements intact.     Conjunctiva/sclera: Conjunctivae normal.  Pulmonary:     Effort: Pulmonary effort is normal.  Genitourinary:    Comments: Deferred with shared decision-making.  Self swab performed. Neurological:     General: No focal deficit present.     Mental Status: She is alert and oriented to person, place, and time. Mental status is at baseline.  Psychiatric:        Mood and Affect: Mood normal.        Behavior: Behavior normal.        Thought Content: Thought content normal.        Judgment: Judgment normal.      UC Treatments / Results  Labs (all labs ordered are listed, but only abnormal results are displayed) Labs Reviewed  RPR  HIV ANTIBODY (ROUTINE TESTING W REFLEX)  POCT URINE PREGNANCY  CERVICOVAGINAL ANCILLARY ONLY    EKG   Radiology No results found.  Procedures Procedures (including critical care time)  Medications Ordered in UC Medications  cefTRIAXone (ROCEPHIN) injection 500 mg (has no administration in time range)    Initial Impression / Assessment and Plan / UC Course  I have reviewed the triage vital signs and the nursing notes.  Pertinent labs & imaging results that were available during my care of the patient were reviewed by me and considered in my medical decision making (see chart for details).     Given possible exposure to gonorrhea, will opt to treat with IM Rocephin today in urgent care.  Cervicovaginal swab pending.  Will await results for any further treatment.  Patient requesting HIV and RPR test as well so this is pending.  Patient advised of safe sex practices and avoiding sexual activity until test results and treatment are complete.  Discussed return precautions.  Patient verbalized understanding and was agreeable with plan. Final Clinical Impressions(s) / UC  Diagnoses   Final diagnoses:  Vaginal discharge  Screening examination for venereal disease     Discharge Instructions      You are being treated for possible gonorrhea exposure today with antibiotic injection.  Your vaginal swab is pending.  We will call if anything is positive and treat as appropriate.  Please refrain from sexual activity until test results and treatment are complete.  Follow-up if symptoms persist or worsen.    ED Prescriptions   None    PDMP not reviewed this encounter.   Teodora Medici, Allison 03/11/22 [id number]

## 2022-03-11 NOTE — Discharge Instructions (Addendum)
You are being treated for possible gonorrhea exposure today with antibiotic injection.  Your vaginal swab is pending.  We will call if anything is positive and treat as appropriate.  Please refrain from sexual activity until test results and treatment are complete.  Follow-up if symptoms persist or worsen.

## 2022-03-11 NOTE — ED Triage Notes (Signed)
Patient presents to [facility] for vaginal discharge x 3 days. Pt states sexual partner having STD symptoms x 1 week. Requesting STD testing and blood work as well. Hx of yeast.

## 2022-03-14 LAB — CERVICOVAGINAL ANCILLARY ONLY
Bacterial Vaginitis (gardnerella): NEGATIVE
Candida Glabrata: NEGATIVE
Candida Vaginitis: NEGATIVE
Chlamydia: NEGATIVE
Comment: NEGATIVE
Comment: NEGATIVE
Comment: NEGATIVE
Comment: NEGATIVE
Comment: NEGATIVE
Comment: NORMAL
Neisseria Gonorrhea: POSITIVE — AB
Trichomonas: NEGATIVE

## 2022-03-15 LAB — RPR: RPR Ser Ql: NONREACTIVE

## 2022-03-15 LAB — HIV ANTIBODY (ROUTINE TESTING W REFLEX): HIV Screen 4th Generation wRfx: NONREACTIVE

## 2022-03-21 ENCOUNTER — Ambulatory Visit
Admission: RE | Admit: 2022-03-21 | Discharge: 2022-03-21 | Disposition: A | Discharge status: Home / Self Care | Payer: Medicaid Other | Source: Ambulatory Visit | Attending: Emergency Medicine | Admitting: Emergency Medicine

## 2022-03-21 VITALS — BP 128/79 | HR 99 | Temp 97.6°F | Resp 18

## 2022-03-21 DIAGNOSIS — Z202 Contact with and (suspected) exposure to infections with a predominantly sexual mode of transmission: Secondary | ICD-10-CM | POA: Diagnosis present

## 2022-03-21 DIAGNOSIS — Z113 Encounter for screening for infections with a predominantly sexual mode of transmission: Secondary | ICD-10-CM

## 2022-03-21 MED ORDER — CEFTRIAXONE SODIUM 500 MG IJ SOLR
500.0000 mg | Freq: Once | INTRAMUSCULAR | Status: AC
  Administered 2022-03-21: 500 mg via INTRAMUSCULAR

## 2022-03-21 NOTE — ED Provider Notes (Signed)
[facility]    CSN: [id number] Arrival date & time: 03/21/22  1125    HISTORY   Chief Complaint  Patient presents with   Pt missed appt time, STD screening   HPI Patricia Sullivan is a pleasant, 21 y.o. female who presents to urgent care today. Patient returns to urgent care for repeat testing for STD.  Patient tested positive for gonorrhea on March 11, 2022, was treated with an injection of ceftriaxone.  Patient denies burning with urination, increased frequency of urination, suprapubic pain, perineal pain, flank pain, fever, chills, malaise, rigors, significant fatigue, abnormal vaginal discharge, vaginal itching, vaginal irritation, dyspareunia, genital lesion(s), and possible exposure to STD.     The history is provided by the patient.   Past Medical History:  Diagnosis Date   Horseshoe kidney    Patient Active Problem List   Diagnosis Date Noted   Adjustment disorder with mixed anxiety and depressed mood 05/05/2020   No past surgical history on file. OB History   No obstetric history on file.    Home Medications    Prior to Admission medications   Medication Sig Start Date End Date Taking? Authorizing Provider  fluticasone (FLONASE) 50 MCG/ACT nasal spray Place 1 spray into both nostrils daily for 3 days. 01/24/22 01/27/22  Teodora Medici, FNP    Family History Family History  Problem Relation Age of Onset   Healthy Mother    Healthy Father    Social History Social History   Tobacco Use   Smoking status: Never   Smokeless tobacco: Never  Substance Use Topics   Alcohol use: Yes    Comment: Occasionally    Drug use: Yes    Types: Marijuana    Comment: Occasionally   Allergies   Patient has no known allergies.  Review of Systems Review of Systems Pertinent findings revealed after performing a 14 point review of systems has been noted in the history of present illness.  Physical Exam Triage Vital Signs ED Triage Vitals  Enc Vitals Group      BP 04/02/21 0827 (!) 147/82     Pulse Rate 04/02/21 0827 72     Resp 04/02/21 0827 18     Temp 04/02/21 0827 98.3 F (36.8 C)     Temp Source 04/02/21 0827 Oral     SpO2 04/02/21 0827 98 %     Weight --      Height --      Head Circumference --      Peak Flow --      Pain Score 04/02/21 0826 5     Pain Loc --      Pain Edu? --      Excl. in [place]? --   No data found.  Updated Vital Signs LMP 02/25/2022 (Approximate)   Physical Exam Vitals and nursing note reviewed.  Constitutional:      General: She is not in acute distress.    Appearance: Normal appearance. She is not ill-appearing.  HENT:     Head: Normocephalic and atraumatic.  Eyes:     General: Lids are normal.        Right eye: No discharge.        Left eye: No discharge.     Extraocular Movements: Extraocular movements intact.     Conjunctiva/sclera: Conjunctivae normal.     Right eye: Right conjunctiva is not injected.     Left eye: Left conjunctiva is not injected.  Neck:     Trachea:  Trachea and phonation normal.  Cardiovascular:     Rate and Rhythm: Normal rate and regular rhythm.     Pulses: Normal pulses.     Heart sounds: Normal heart sounds. No murmur heard.    No friction rub. No gallop.  Pulmonary:     Effort: Pulmonary effort is normal. No accessory muscle usage, prolonged expiration or respiratory distress.     Breath sounds: Normal breath sounds. No stridor, decreased air movement or transmitted upper airway sounds. No decreased breath sounds, wheezing, rhonchi or rales.  Chest:     Chest wall: No tenderness.  Genitourinary:    Comments: Patient politely declines pelvic exam today, patient provided a vaginal swab for testing. Musculoskeletal:        General: Normal range of motion.     Cervical back: Normal range of motion and neck supple. Normal range of motion.  Lymphadenopathy:     Cervical: No cervical adenopathy.  Skin:    General: Skin is warm and dry.     Findings: No erythema or  rash.  Neurological:     General: No focal deficit present.     Mental Status: She is alert and oriented to person, place, and time.  Psychiatric:        Mood and Affect: Mood normal.        Behavior: Behavior normal.     Visual Acuity Right Eye Distance:   Left Eye Distance:   Bilateral Distance:    Right Eye Near:   Left Eye Near:    Bilateral Near:     UC Couse / Diagnostics / Procedures:     Radiology No results found.  Procedures Procedures (including critical care time) EKG  Pending results:  Labs Reviewed  CERVICOVAGINAL ANCILLARY ONLY    Medications Ordered in UC: Medications - No data to display  UC Diagnoses / Final Clinical Impressions(s)   I have reviewed the triage vital signs and the nursing notes.  Pertinent labs & imaging results that were available during my care of the patient were reviewed by me and considered in my medical decision making (see chart for details).    Final diagnoses:  Screening examination for STD (sexually transmitted disease)    {LMSTDP:27058}  {LMUTIP:27060}  ED Prescriptions   None    PDMP not reviewed this encounter.  Disposition Upon Discharge:  Condition: stable for discharge home  Patient presented with concern for an acute illness with associated systemic symptoms and significant discomfort requiring urgent management. In my opinion, this is a condition that a prudent lay person (someone who possesses an average knowledge of health and medicine) may potentially expect to result in complications if not addressed urgently such as respiratory distress, impairment of bodily function or dysfunction of bodily organs.   As such, the patient has been evaluated and assessed, work-up was performed and treatment was provided in alignment with urgent care protocols and evidence based medicine.  Patient/parent/caregiver has been advised that the patient may require follow up for further testing and/or treatment if the  symptoms continue in spite of treatment, as clinically indicated and appropriate.  Routine symptom specific, illness specific and/or disease specific instructions were discussed with the patient and/or caregiver at length.  Prevention strategies for avoiding STD exposure were also discussed.  The patient will follow up with their current PCP if and as advised. If the patient does not currently have a PCP we will assist them in obtaining one.   The patient may need specialty follow  up if the symptoms continue, in spite of conservative treatment and management, for further workup, evaluation, consultation and treatment as clinically indicated and appropriate.  Patient/parent/caregiver verbalized understanding and agreement of plan as discussed.  All questions were addressed during visit.  Please see discharge instructions below for further details of plan.  Discharge Instructions: Discharge Instructions   None     This office note has been dictated using Dragon speech recognition software.  Unfortunately, this method of dictation can sometimes lead to typographical or grammatical errors.  I apologize for your inconvenience in advance if this occurs.  Please do not hesitate to reach out to me if clarification is needed.

## 2022-03-21 NOTE — ED Triage Notes (Addendum)
The patient states she tested positive for gonorrhea and was treated but had intercourse with partner again before they were treated.  The patient denies having any symptoms.

## 2022-03-21 NOTE — Discharge Instructions (Signed)
The results of your vaginal swab test which screens for BV, yeast, gonorrhea, chlamydia and trichomonas will be made posted to your MyChart account once it is complete.  This typically takes 2 to 4 days.  Please abstain from sexual intercourse of any kind, vaginal, oral or anal, until you have received the results of your STD testing.     If any of your results are abnormal, you will receive a phone call regarding treatment.  Prescriptions, if any are needed, will be provided for you at your pharmacy.     Based on the symptoms and concerns you shared with me today, you were treated for presumed gonorrhea with an injection of ceftriaxone 500 mg.  This is the only treatment you will need for gonorrhea.  Please abstain from sexual intercourse of any kind, vaginal, oral or anal, for 7 days.   Please remember that your body is a [place] and you are the [place].  The only way to prevent transmission of sexually transmitted disease when having sexual intercourse is to use condoms.  Repeat sexually transmitted infections can cause scarring in your fallopian tubes which will interfere with your ability to have children.  Repeat exposures to sexually transmitted diseases can also increase your risk of contracting HPV, the human papilloma virus which causes cervical cancer and genital warts and HIV.   If you have not had complete resolution of your symptoms after completing any needed treatment, please return for repeat evaluation.   Thank you for visiting urgent care today.  I appreciate the opportunity to participate in your care.

## 2022-03-22 LAB — CERVICOVAGINAL ANCILLARY ONLY
Bacterial Vaginitis (gardnerella): POSITIVE — AB
Candida Glabrata: NEGATIVE
Candida Vaginitis: POSITIVE — AB
Chlamydia: NEGATIVE
Comment: NEGATIVE
Comment: NEGATIVE
Comment: NEGATIVE
Comment: NEGATIVE
Comment: NEGATIVE
Comment: NORMAL
Neisseria Gonorrhea: NEGATIVE
Trichomonas: NEGATIVE

## 2022-03-23 ENCOUNTER — Telehealth (HOSPITAL_COMMUNITY): Payer: Self-pay | Admitting: Emergency Medicine

## 2022-03-23 MED ORDER — METRONIDAZOLE 500 MG PO TABS: 500.0000 mg | ORAL_TABLET | Freq: Two times a day (BID) | ORAL | 0 refills | Status: DC

## 2022-03-23 MED ORDER — FLUCONAZOLE 150 MG PO TABS: 150.0000 mg | ORAL_TABLET | Freq: Once | ORAL | 0 refills | Status: AC

## 2022-03-29 ENCOUNTER — Ambulatory Visit
Admission: EM | Admit: 2022-03-29 | Discharge: 2022-03-29 | Disposition: A | Discharge status: Home / Self Care | Payer: Medicaid Other | Attending: Physician Assistant | Admitting: Physician Assistant

## 2022-03-29 DIAGNOSIS — Z1152 Encounter for screening for COVID-19: Secondary | ICD-10-CM | POA: Insufficient documentation

## 2022-03-29 DIAGNOSIS — R509 Fever, unspecified: Secondary | ICD-10-CM | POA: Insufficient documentation

## 2022-03-29 DIAGNOSIS — J069 Acute upper respiratory infection, unspecified: Secondary | ICD-10-CM | POA: Diagnosis not present

## 2022-03-29 DIAGNOSIS — R051 Acute cough: Secondary | ICD-10-CM | POA: Diagnosis present

## 2022-03-29 DIAGNOSIS — J029 Acute pharyngitis, unspecified: Secondary | ICD-10-CM | POA: Diagnosis not present

## 2022-03-29 LAB — POCT RAPID STREP A (OFFICE): Rapid Strep A Screen: NEGATIVE

## 2022-03-29 LAB — POCT INFLUENZA A/B
Influenza A, POC: NEGATIVE
Influenza B, POC: NEGATIVE

## 2022-03-29 MED ORDER — DM-GUAIFENESIN ER 30-600 MG PO TB12: 1.0000 | ORAL_TABLET | Freq: Two times a day (BID) | ORAL | 0 refills | Status: DC

## 2022-03-29 MED ORDER — IBUPROFEN 600 MG PO TABS: 600.0000 mg | ORAL_TABLET | Freq: Four times a day (QID) | ORAL | 0 refills | Status: DC | PRN

## 2022-03-29 MED ORDER — LIDOCAINE VISCOUS HCL 2 % MT SOLN: 5.0000 mL | Freq: Four times a day (QID) | OROMUCOSAL | 0 refills | Status: DC

## 2022-03-29 MED ORDER — ONDANSETRON HCL 4 MG PO TABS: 4.0000 mg | ORAL_TABLET | Freq: Three times a day (TID) | ORAL | 0 refills | Status: DC | PRN

## 2022-03-29 NOTE — ED Triage Notes (Addendum)
Pt presents to [facility] with co of fever (101.4 yesterday tympanic) , cough, congestion, sore throat, nausea, generalized back pain,  ha for 2 days. Pt reports she has taken motrin and flu medication for symptoms. Pt st at home covid was neg just traveled from [place].

## 2022-03-29 NOTE — Discharge Instructions (Addendum)
Advised take the Zofran every 6 hours as needed for nausea. Advised take ibuprofen 600 mg every 8 hours to help treat fever and throat pain. Is use salt water gargles and lozenges to help soothe the throat discomfort. Take Mucinex DM every 12 hours to treat the cough and congestion. Has follow-up PCP return to urgent care if symptoms fail to improve.

## 2022-03-29 NOTE — ED Provider Notes (Signed)
[name]    CSN: [id number] Arrival date & time: 03/29/22  1301      History   Chief Complaint Chief Complaint  Patient presents with   Influenza    Entered by patient    HPI Patricia Sullivan is a 21 y.o. female.   21 year old female presents with fever, sore throat, body aches.  Patient indicates that she returned from [facility] about 2 days ago.  Patient indicates she started having sore throat, painful swallowing, which has become progressive.  She also indicates she has had mild cough, congestion, rhinitis with mainly clear production.  She also has fatigue, chills, body aches and pain, and relates that her fever yesterday was 101.  Patient indicates that she did a COVID test this morning and it was negative.  Patient indicates she does have some nausea with her symptoms but she has not thrown up.  She has taken some OTC cold medicines with minimal relief.  States she is tolerating fluids well.  She relates she has not been around any friends, family, or coworkers that have had similar symptoms.   Influenza Presenting symptoms: cough, fatigue, fever and sore throat   Associated symptoms: chills     Past Medical History:  Diagnosis Date   Horseshoe kidney     Patient Active Problem List   Diagnosis Date Noted   Adjustment disorder with mixed anxiety and depressed mood 05/05/2020    History reviewed. No pertinent surgical history.  OB History   No obstetric history on file.      Home Medications    Prior to Admission medications   Medication Sig Start Date End Date Taking? Authorizing Provider  dextromethorphan-guaiFENesin (MUCINEX DM) 30-600 MG 12hr tablet Take 1 tablet by mouth 2 (two) times daily. 03/29/22  Yes Nyoka Lint, PA-C  ibuprofen (ADVIL) 600 MG tablet Take 1 tablet (600 mg total) by mouth every 6 (six) hours as needed. 03/29/22  Yes Nyoka Lint, PA-C  magic mouthwash (lidocaine, diphenhydrAMINE, alum & mag hydroxide) suspension  Swish and swallow 5 mLs 4 (four) times daily. 03/29/22  Yes Nyoka Lint, PA-C  ondansetron (ZOFRAN) 4 MG tablet Take 1 tablet (4 mg total) by mouth every 8 (eight) hours as needed for nausea or vomiting. 03/29/22  Yes Nyoka Lint, PA-C  fluticasone [facility]) 50 MCG/ACT nasal spray Place 1 spray into both nostrils daily for 3 days. 01/24/22 01/27/22  Teodora Medici, FNP  metroNIDAZOLE (FLAGYL) 500 MG tablet Take 1 tablet (500 mg total) by mouth 2 (two) times daily. 03/23/22   Lamptey, Myrene Galas, MD    Family History Family History  Problem Relation Age of Onset   Healthy Mother    Healthy Father     Social History Social History   Tobacco Use   Smoking status: Never   Smokeless tobacco: Never  Substance Use Topics   Alcohol use: Yes    Comment: Occasionally    Drug use: Yes    Types: Marijuana    Comment: Occasionally     Allergies   Patient has no known allergies.   Review of Systems Review of Systems  Constitutional:  Positive for chills, fatigue and fever.  HENT:  Positive for sore throat.   Respiratory:  Positive for cough.      Physical Exam Triage Vital Signs ED Triage Vitals  Enc Vitals Group     BP 03/29/22 1357 109/72     Pulse Rate 03/29/22 1357 (!) 119     Resp 03/29/22  1357 18     Temp 03/29/22 1357 98.9 F (37.2 C)     Temp src --      SpO2 03/29/22 1357 98 %     Weight --      Height --      Head Circumference --      Peak Flow --      Pain Score 03/29/22 1355 7     Pain Loc --      Pain Edu? --      Excl. in [place]? --    No data found.  Updated Vital Signs BP 109/72   Pulse (!) 119   Temp 98.9 F (37.2 C)   Resp 18   LMP 03/18/2022 (Approximate)   SpO2 98%   Visual Acuity Right Eye Distance:   Left Eye Distance:   Bilateral Distance:    Right Eye Near:   Left Eye Near:    Bilateral Near:     Physical Exam Constitutional:      Appearance: Normal appearance.  HENT:     Right Ear: Tympanic membrane and ear canal normal.      Left Ear: Tympanic membrane and ear canal normal.     Mouth/Throat:     Mouth: Mucous membranes are moist.     Pharynx: Oropharyngeal exudate and posterior oropharyngeal erythema present.  Cardiovascular:     Rate and Rhythm: Normal rate and regular rhythm.     Heart sounds: Normal heart sounds.  Pulmonary:     Effort: Pulmonary effort is normal.     Breath sounds: Normal breath sounds and air entry. No wheezing, rhonchi or rales.  Lymphadenopathy:     Cervical: Cervical adenopathy (mild anterior adenopathy present bilat) present.  Neurological:     Mental Status: She is alert.      UC Treatments / Results  Labs (all labs ordered are listed, but only abnormal results are displayed) Labs Reviewed  CULTURE, GROUP A STREP [facility])  POCT INFLUENZA A/B  POCT RAPID STREP A (OFFICE)    EKG   Radiology No results found.  Procedures Procedures (including critical care time)  Medications Ordered in UC Medications - No data to display  Initial Impression / Assessment and Plan / UC Course  I have reviewed the triage vital signs and the nursing notes.  Pertinent labs & imaging results that were available during my care of the patient were reviewed by me and considered in my medical decision making (see chart for details).    Plan: 1.  The acute pharyngitis will be treated with the following: A.  Throat culture is pending. B.  Ibuprofen 600 mg every 8 hours with food to help relieve pain and discomfort. 2.  The fever will be treated with the following: A.  Ibuprofen 600 mg every 8 hours with food to help relieve discomfort. 3.  The acute cough be treated with the following: A.  Mucinex DM every 12 hours to treat cough and congestion. 4.  The acute upper respiratory infection will be treated with the following: A.  Flu test result is pending. B.  Mucinex DM to treat the cough and congestion. 4.  Advised to follow-up with PCP or return to urgent care if symptoms fail to  improve.  Final Clinical Impressions(s) / UC Diagnoses   Final diagnoses:  Fever, unspecified  Acute pharyngitis, unspecified etiology  Acute cough  Acute upper respiratory infection     Discharge Instructions      Advised take the Zofran  every 6 hours as needed for nausea. Advised take ibuprofen 600 mg every 8 hours to help treat fever and throat pain. Is use salt water gargles and lozenges to help soothe the throat discomfort. Take Mucinex DM every 12 hours to treat the cough and congestion. Has follow-up PCP return to urgent care if symptoms fail to improve.     ED Prescriptions     Medication Sig Dispense Auth. Provider   ondansetron (ZOFRAN) 4 MG tablet Take 1 tablet (4 mg total) by mouth every 8 (eight) hours as needed for nausea or vomiting. 20 tablet Nyoka Lint, PA-C   ibuprofen (ADVIL) 600 MG tablet Take 1 tablet (600 mg total) by mouth every 6 (six) hours as needed. 30 tablet Nyoka Lint, PA-C   dextromethorphan-guaiFENesin [facility] DM) 30-600 MG 12hr tablet Take 1 tablet by mouth 2 (two) times daily. 20 tablet Nyoka Lint, PA-C   magic mouthwash (lidocaine, diphenhydrAMINE, alum & mag hydroxide) suspension Swish and swallow 5 mLs 4 (four) times daily. 75 mL Nyoka Lint, PA-C      PDMP not reviewed this encounter.   Nyoka Lint, PA-C 03/29/22 1424

## 2022-03-30 LAB — SARS CORONAVIRUS 2 (TAT 6-24 HRS): SARS Coronavirus 2: NEGATIVE

## 2022-04-21 ENCOUNTER — Other Ambulatory Visit: Payer: Self-pay

## 2022-04-25 LAB — CULTURE, GROUP A STREP

## 2022-04-25 LAB — SPECIMEN STATUS REPORT

## 2022-05-12 ENCOUNTER — Ambulatory Visit: Payer: Self-pay

## 2022-05-13 ENCOUNTER — Ambulatory Visit
Admission: RE | Admit: 2022-05-13 | Discharge: 2022-05-13 | Disposition: A | Discharge status: Home / Self Care | Payer: Medicaid Other | Source: Ambulatory Visit | Attending: Emergency Medicine | Admitting: Emergency Medicine

## 2022-05-13 VITALS — BP 106/66 | HR 97 | Temp 98.2°F | Resp 17

## 2022-05-13 DIAGNOSIS — Z113 Encounter for screening for infections with a predominantly sexual mode of transmission: Secondary | ICD-10-CM | POA: Diagnosis present

## 2022-05-13 NOTE — ED Triage Notes (Signed)
Here for STI testing.

## 2022-05-13 NOTE — ED Provider Notes (Signed)
HPI  SUBJECTIVE:  Patricia Sullivan is a 21 y.o. female who presents with ***    Past Medical History:  Diagnosis Date   Horseshoe kidney     History reviewed. No pertinent surgical history.  Family History  Problem Relation Age of Onset   Healthy Mother    Healthy Father     Social History   Tobacco Use   Smoking status: Never   Smokeless tobacco: Never  Substance Use Topics   Alcohol use: Yes    Comment: Occasionally    Drug use: Yes    Types: Marijuana    Comment: Occasionally    No current facility-administered medications for this encounter.  Current Outpatient Medications:    dextromethorphan-guaiFENesin (MUCINEX DM) 30-600 MG 12hr tablet, Take 1 tablet by mouth 2 (two) times daily., Disp: 20 tablet, Rfl: 0   fluticasone (FLONASE) 50 MCG/ACT nasal spray, Place 1 spray into both nostrils daily for 3 days., Disp: 16 g, Rfl: 0   ibuprofen (ADVIL) 600 MG tablet, Take 1 tablet (600 mg total) by mouth every 6 (six) hours as needed., Disp: 30 tablet, Rfl: 0   magic mouthwash (lidocaine, diphenhydrAMINE, alum & mag hydroxide) suspension, Swish and swallow 5 mLs 4 (four) times daily., Disp: 75 mL, Rfl: 0   metroNIDAZOLE (FLAGYL) 500 MG tablet, Take 1 tablet (500 mg total) by mouth 2 (two) times daily., Disp: 14 tablet, Rfl: 0   ondansetron (ZOFRAN) 4 MG tablet, Take 1 tablet (4 mg total) by mouth every 8 (eight) hours as needed for nausea or vomiting., Disp: 20 tablet, Rfl: 0  No Known Allergies   ROS  As noted in HPI.   Physical Exam  BP 106/66 (BP Location: Right Arm)   Pulse 97   Temp 98.2 F (36.8 C) (Oral)   Resp 17   SpO2 97%   Constitutional: Well developed, well nourished, no acute distress Eyes:  EOMI, conjunctiva normal bilaterally HENT: Normocephalic, atraumatic,mucus membranes moist Respiratory: Normal inspiratory effort Cardiovascular: Normal rate GI: nondistended soft, nontender. Back: No CVAT skin: No rash, skin intact Musculoskeletal: no  deformities Neurologic: Alert & oriented x 3, no focal neuro deficits Psychiatric: Speech and behavior appropriate   ED Course   Medications - No data to display  No orders of the defined types were placed in this encounter.   No results found for this or any previous visit (from the past 24 hour(s)). No results found.  ED Clinical Impression  No diagnosis found.   ED Assessment/Plan   {The patient has been seen in Urgent Care in the last 3 years. :1}   Sending off swab for gonorrhea, chlamydia, trichomonas, BV.  Checking HIV and RPR.  Patient otherwise asymptomatic.  Advised her to refrain from intercourse until all of her labs are resulted and she and her partners have been treated if necessary.  Follow-up with PCP as needed Discussed medical decision making, with patient. patient agrees with plan.   No orders of the defined types were placed in this encounter.     *This clinic note was created using Dragon dictation software. Therefore, there may be occasional mistakes despite careful proofreading.  ?

## 2022-05-13 NOTE — Discharge Instructions (Signed)
Give us a working phone number so that we can contact you if needed. Refrain from sexual contact until all of your labs have come back, symptoms have resolved, and your partner(s) are treated if necessary.   Go to www.goodrx.com  or www.costplusdrugs.com to look up your medications. This will give you a list of where you can find your prescriptions at the most affordable prices. Or ask the pharmacist what the cash price is, or if they have any other discount programs available to help make your medication more affordable. This can be less expensive than what you would pay with insurance.   

## 2022-05-16 ENCOUNTER — Telehealth (HOSPITAL_COMMUNITY): Payer: Self-pay | Admitting: Emergency Medicine

## 2022-05-16 LAB — CERVICOVAGINAL ANCILLARY ONLY
Bacterial Vaginitis (gardnerella): NEGATIVE
Chlamydia: POSITIVE — AB
Comment: NEGATIVE
Comment: NEGATIVE
Comment: NEGATIVE
Comment: NORMAL
Neisseria Gonorrhea: NEGATIVE
Trichomonas: NEGATIVE

## 2022-05-16 LAB — HIV ANTIBODY (ROUTINE TESTING W REFLEX): HIV Screen 4th Generation wRfx: NONREACTIVE

## 2022-05-16 LAB — RPR: RPR Ser Ql: NONREACTIVE

## 2022-05-16 MED ORDER — DOXYCYCLINE HYCLATE 100 MG PO CAPS: 100.0000 mg | ORAL_CAPSULE | Freq: Two times a day (BID) | ORAL | 0 refills | Status: AC

## 2022-05-17 ENCOUNTER — Ambulatory Visit
Admission: RE | Admit: 2022-05-17 | Discharge: 2022-05-17 | Disposition: A | Discharge status: Home / Self Care | Payer: Medicaid Other | Source: Ambulatory Visit | Attending: Emergency Medicine | Admitting: Emergency Medicine

## 2022-05-17 VITALS — BP 111/67 | HR 88 | Temp 97.6°F | Resp 16

## 2022-05-17 DIAGNOSIS — Z113 Encounter for screening for infections with a predominantly sexual mode of transmission: Secondary | ICD-10-CM | POA: Diagnosis not present

## 2022-05-17 LAB — POCT URINALYSIS DIP (MANUAL ENTRY)
Bilirubin, UA: NEGATIVE
Glucose, UA: NEGATIVE mg/dL
Ketones, POC UA: NEGATIVE mg/dL
Leukocytes, UA: NEGATIVE
Nitrite, UA: NEGATIVE
Protein Ur, POC: 30 mg/dL — AB
Spec Grav, UA: 1.025 (ref 1.010–1.025)
Urobilinogen, UA: 0.2 E.U./dL
pH, UA: 7.5 (ref 5.0–8.0)

## 2022-05-17 LAB — POCT URINE PREGNANCY: Preg Test, Ur: NEGATIVE

## 2022-05-17 NOTE — ED Triage Notes (Signed)
Patient requesting to be tested for STDs, the pt denies having symptoms.

## 2022-05-17 NOTE — ED Provider Notes (Signed)
[facility]    CSN: [id number] Arrival date & time: 05/17/22  1151    HISTORY   Chief Complaint  Patient presents with   SEXUALLY TRANSMITTED DISEASE    Got tested too soon - Entered by patient   HPI Patricia Sullivan is a pleasant, 21 y.o. female who presents to urgent care today. Patient presents to urgent care requesting testing for STD.  Patient states she does not have any symptoms of STD at this time.  EMR reviewed, patient was seen May 13, 2022 for STD testing, tested positive for chlamydia.  Patient was contacted by phone and advised of positive diagnosis, prescription for doxycycline was provided for her, pt states she is currently taking the doxycycline.  The reason she is requesting testing is that the has ben with two different partners and feels like she may have tested for STDs too soon after her last encounter with one partner and got chlamydia from the other partner.     Past Medical History:  Diagnosis Date   Horseshoe kidney    Patient Active Problem List   Diagnosis Date Noted   Adjustment disorder with mixed anxiety and depressed mood 05/05/2020   History reviewed. No pertinent surgical history. OB History   No obstetric history on file.    Home Medications    Prior to Admission medications   Medication Sig Start Date End Date Taking? Authorizing Provider  doxycycline (VIBRAMYCIN) 100 MG capsule Take 1 capsule (100 mg total) by mouth 2 (two) times daily for 7 days. 05/16/22 05/23/22  Merrilee Jansky, MD  fluticasone (FLONASE) 50 MCG/ACT nasal spray Place 1 spray into both nostrils daily for 3 days. 01/24/22 01/27/22  Gustavus Bryant, FNP  ibuprofen (ADVIL) 600 MG tablet Take 1 tablet (600 mg total) by mouth every 6 (six) hours as needed. 03/29/22   Ellsworth Lennox, PA-C  ondansetron (ZOFRAN) 4 MG tablet Take 1 tablet (4 mg total) by mouth every 8 (eight) hours as needed for nausea or vomiting. 03/29/22   Ellsworth Lennox, PA-C    Family  History Family History  Problem Relation Age of Onset   Healthy Mother    Healthy Father    Social History Social History   Tobacco Use   Smoking status: Never   Smokeless tobacco: Never  Substance Use Topics   Alcohol use: Yes    Comment: Occasionally    Drug use: Yes    Types: Marijuana    Comment: Occasionally   Allergies   Patient has no known allergies.  Review of Systems Review of Systems Pertinent findings revealed after performing a 14 point review of systems has been noted in the history of present illness.  Physical Exam Triage Vital Signs ED Triage Vitals  Enc Vitals Group     BP 04/02/21 0827 (!) 147/82     Pulse Rate 04/02/21 0827 72     Resp 04/02/21 0827 18     Temp 04/02/21 0827 98.3 F (36.8 C)     Temp Source 04/02/21 0827 Oral     SpO2 04/02/21 0827 98 %     Weight --      Height --      Head Circumference --      Peak Flow --      Pain Score 04/02/21 0826 5     Pain Loc --      Pain Edu? --      Excl. in [place]? --   No data found.  Updated  Vital Signs BP 111/67 (BP Location: Left Arm)   Pulse 88   Temp 97.6 F (36.4 C) (Oral)   Resp 16   SpO2 98%   Physical Exam Vitals and nursing note reviewed.  Constitutional:      General: She is not in acute distress.    Appearance: Normal appearance. She is not ill-appearing.  HENT:     Head: Normocephalic and atraumatic.  Eyes:     General: Lids are normal.        Right eye: No discharge.        Left eye: No discharge.     Extraocular Movements: Extraocular movements intact.     Conjunctiva/sclera: Conjunctivae normal.     Right eye: Right conjunctiva is not injected.     Left eye: Left conjunctiva is not injected.  Neck:     Trachea: Trachea and phonation normal.  Cardiovascular:     Rate and Rhythm: Normal rate and regular rhythm.     Pulses: Normal pulses.     Heart sounds: Normal heart sounds. No murmur heard.    No friction rub. No gallop.  Pulmonary:     Effort: Pulmonary  effort is normal. No accessory muscle usage, prolonged expiration or respiratory distress.     Breath sounds: Normal breath sounds. No stridor, decreased air movement or transmitted upper airway sounds. No decreased breath sounds, wheezing, rhonchi or rales.  Chest:     Chest wall: No tenderness.  Genitourinary:    Comments: Patient politely declines pelvic exam today, patient provided a vaginal swab for testing. Musculoskeletal:        General: Normal range of motion.     Cervical back: Normal range of motion and neck supple. Normal range of motion.  Lymphadenopathy:     Cervical: No cervical adenopathy.  Skin:    General: Skin is warm and dry.     Findings: No erythema or rash.  Neurological:     General: No focal deficit present.     Mental Status: She is alert and oriented to person, place, and time.  Psychiatric:        Mood and Affect: Mood normal.        Behavior: Behavior normal.     Visual Acuity Right Eye Distance:   Left Eye Distance:   Bilateral Distance:    Right Eye Near:   Left Eye Near:    Bilateral Near:     UC Couse / Diagnostics / Procedures:     Radiology No results found.  Procedures Procedures (including critical care time) EKG  Pending results:  Labs Reviewed  POCT URINE PREGNANCY  POCT URINALYSIS DIP (MANUAL ENTRY)  CERVICOVAGINAL ANCILLARY ONLY    Medications Ordered in UC: Medications - No data to display  UC Diagnoses / Final Clinical Impressions(s)   I have reviewed the triage vital signs and the nursing notes.  Pertinent labs & imaging results that were available during my care of the patient were reviewed by me and considered in my medical decision making (see chart for details).    Final diagnoses:  Screening examination for STD (sexually transmitted disease)   Patient to finish full course of doxycycline. STD screening was performed, patient advised that the results be posted to their MyChart and if any of the results are  positive, they will be notified by phone, further treatment will be provided as indicated based on results of STD screening. Patient was advised to abstain from sexual intercourse until that they receive  the results of their STD testing.  Patient was also advised to use condoms to protect themselves from STD exposure. Urinalysis today was normal. Urine pregnancy test was negative. Return precautions advised.  Drug allergies reviewed, all questions addressed.   Please see discharge instructions below for details of plan of care as provided to patient. ED Prescriptions   None    PDMP not reviewed this encounter.  Disposition Upon Discharge:  Condition: stable for discharge home  Patient presented with concern for an acute illness with associated systemic symptoms and significant discomfort requiring urgent management. In my opinion, this is a condition that a prudent lay person (someone who possesses an average knowledge of health and medicine) may potentially expect to result in complications if not addressed urgently such as respiratory distress, impairment of bodily function or dysfunction of bodily organs.   As such, the patient has been evaluated and assessed, work-up was performed and treatment was provided in alignment with urgent care protocols and evidence based medicine.  Patient/parent/caregiver has been advised that the patient may require follow up for further testing and/or treatment if the symptoms continue in spite of treatment, as clinically indicated and appropriate.  Routine symptom specific, illness specific and/or disease specific instructions were discussed with the patient and/or caregiver at length.  Prevention strategies for avoiding STD exposure were also discussed.  The patient will follow up with their current PCP if and as advised. If the patient does not currently have a PCP we will assist them in obtaining one.   The patient may need specialty follow up if the  symptoms continue, in spite of conservative treatment and management, for further workup, evaluation, consultation and treatment as clinically indicated and appropriate.  Patient/parent/caregiver verbalized understanding and agreement of plan as discussed.  All questions were addressed during visit.  Please see discharge instructions below for further details of plan.  Discharge Instructions:   Discharge Instructions      The results of your vaginal swab test which screens for BV, yeast, gonorrhea, chlamydia and trichomonas will be made posted to your MyChart account once it is complete.  This typically takes 2 to 4 days.  Please abstain from sexual intercourse of any kind, vaginal, oral or anal, until you have received the results of your STD testing.     If any of your results are abnormal, you will receive a phone call regarding treatment.  Prescriptions, if any are needed, will be provided for you at your pharmacy.     Please remember that your body is a [place] and you are the Goddess.  The only way to prevent transmission of sexually transmitted disease when having sexual intercourse is to use condoms.  Repeat sexually transmitted infections can cause scarring in your fallopian tubes which will interfere with your ability to have children.  Repeat exposures to sexually transmitted diseases can also increase your risk of contracting HPV, the human papilloma virus which causes cervical cancer and genital warts and HIV.   Thank you for visiting urgent care today.  I appreciate the opportunity to participate in your care.       This office note has been dictated using Teaching laboratory technician.  Unfortunately, this method of dictation can sometimes lead to typographical or grammatical errors.  I apologize for your inconvenience in advance if this occurs.  Please do not hesitate to reach out to me if clarification is needed.       Theadora Rama Scales, PA-C 05/17/22 1242

## 2022-05-17 NOTE — Discharge Instructions (Signed)
The results of your vaginal swab test which screens for BV, yeast, gonorrhea, chlamydia and trichomonas will be made posted to your MyChart account once it is complete.  This typically takes 2 to 4 days.  Please abstain from sexual intercourse of any kind, vaginal, oral or anal, until you have received the results of your STD testing.     If any of your results are abnormal, you will receive a phone call regarding treatment.  Prescriptions, if any are needed, will be provided for you at your pharmacy.     Please remember that your body is a [place] and you are the Goddess.  The only way to prevent transmission of sexually transmitted disease when having sexual intercourse is to use condoms.  Repeat sexually transmitted infections can cause scarring in your fallopian tubes which will interfere with your ability to have children.  Repeat exposures to sexually transmitted diseases can also increase your risk of contracting HPV, the human papilloma virus which causes cervical cancer and genital warts and HIV.   Thank you for visiting urgent care today.  I appreciate the opportunity to participate in your care.

## 2022-05-18 ENCOUNTER — Telehealth: Payer: Self-pay

## 2022-05-18 LAB — CERVICOVAGINAL ANCILLARY ONLY
Bacterial Vaginitis (gardnerella): NEGATIVE
Candida Glabrata: NEGATIVE
Candida Vaginitis: NEGATIVE
Chlamydia: POSITIVE — AB
Comment: NEGATIVE
Comment: NEGATIVE
Comment: NEGATIVE
Comment: NEGATIVE
Comment: NEGATIVE
Comment: NORMAL
Neisseria Gonorrhea: NEGATIVE
Trichomonas: NEGATIVE

## 2022-05-18 NOTE — Telephone Encounter (Signed)
Patient called stating the doxycycline (VIBRAMYCIN) 100 MG capsule, is causing nausea and vomiting.  Patient states she has tried taking it with food and still not able to keep it down.   Requesting a different medication.

## 2022-05-18 NOTE — Telephone Encounter (Signed)
Vaginal swab results are pending.  Discussed the options with patient and for now she prefers to avoid changing the antibiotic.  I am in agreement.  Will make changes based off of the results.  She states that she has leftover ondansetron from a previous prescription.  Did not want any more medications.  She plans on taking the doxycycline with food consistently now.

## 2022-07-12 ENCOUNTER — Ambulatory Visit
Admission: RE | Admit: 2022-07-12 | Discharge: 2022-07-12 | Disposition: A | Discharge status: Home / Self Care | Payer: Medicaid Other | Source: Ambulatory Visit | Attending: Nurse Practitioner | Admitting: Nurse Practitioner

## 2022-07-12 VITALS — BP 107/65 | HR 107 | Temp 97.8°F | Resp 18

## 2022-07-12 DIAGNOSIS — N898 Other specified noninflammatory disorders of vagina: Secondary | ICD-10-CM | POA: Diagnosis present

## 2022-07-12 DIAGNOSIS — Z113 Encounter for screening for infections with a predominantly sexual mode of transmission: Secondary | ICD-10-CM | POA: Insufficient documentation

## 2022-07-12 NOTE — ED Triage Notes (Signed)
Patient presenting for STD testing, she denies symptoms.

## 2022-07-12 NOTE — ED Provider Notes (Signed)
[facility]    CSN: [id number] Arrival date & time: 07/12/22  1246      History   Chief Complaint Chief Complaint  Patient presents with   SEXUALLY TRANSMITTED DISEASE    HPI Patricia Sullivan is a 22 y.o. female presents for evaluation of STD screening.  Patient reports vaginal discharge x 2 weeks but denies any STD exposure.  No dysuria, fevers, flank pain, nausea/vomiting. Patient was seen in urgent care on 05/17/2022 for STD screening.  She was positive for chlamydia and was treated with doxycycline with resolution of symptoms.  No other concerns at this time.  HPI  Past Medical History:  Diagnosis Date   Horseshoe kidney     Patient Active Problem List   Diagnosis Date Noted   Adjustment disorder with mixed anxiety and depressed mood 05/05/2020    History reviewed. No pertinent surgical history.  OB History   No obstetric history on file.      Home Medications    Prior to Admission medications   Medication Sig Start Date End Date Taking? Authorizing Provider  fluticasone (FLONASE) 50 MCG/ACT nasal spray Place 1 spray into both nostrils daily for 3 days. 01/24/22 01/27/22  Teodora Medici, FNP  ibuprofen (ADVIL) 600 MG tablet Take 1 tablet (600 mg total) by mouth every 6 (six) hours as needed. 03/29/22   Nyoka Lint, PA-C  ondansetron (ZOFRAN) 4 MG tablet Take 1 tablet (4 mg total) by mouth every 8 (eight) hours as needed for nausea or vomiting. 03/29/22   Nyoka Lint, PA-C    Family History Family History  Problem Relation Age of Onset   Healthy Mother    Healthy Father     Social History Social History   Tobacco Use   Smoking status: Never   Smokeless tobacco: Never  Substance Use Topics   Alcohol use: Yes    Comment: Occasionally    Drug use: Yes    Types: Marijuana    Comment: Occasionally     Allergies   Patient has no known allergies.   Review of Systems Review of Systems  Genitourinary:        STD screening     Physical  Exam Triage Vital Signs ED Triage Vitals  Enc Vitals Group     BP 07/12/22 1254 107/65     Pulse Rate 07/12/22 1254 (!) 107     Resp 07/12/22 1254 18     Temp 07/12/22 1254 97.8 F (36.6 C)     Temp Source 07/12/22 1254 Oral     SpO2 07/12/22 1254 98 %     Weight --      Height --      Head Circumference --      Peak Flow --      Pain Score 07/12/22 1255 0     Pain Loc --      Pain Edu? --      Excl. in [place]? --    No data found.  Updated Vital Signs BP 107/65 (BP Location: Right Arm)   Pulse (!) 107   Temp 97.8 F (36.6 C) (Oral)   Resp 18   LMP 06/24/2022 (Approximate)   SpO2 98%   Visual Acuity Right Eye Distance:   Left Eye Distance:   Bilateral Distance:    Right Eye Near:   Left Eye Near:    Bilateral Near:     Physical Exam Vitals and nursing note reviewed.  Constitutional:      Appearance:  Normal appearance.  HENT:     Head: Normocephalic and atraumatic.  Eyes:     Pupils: Pupils are equal, round, and reactive to light.  Cardiovascular:     Rate and Rhythm: Normal rate.  Pulmonary:     Effort: Pulmonary effort is normal.  Abdominal:     Tenderness: There is no right CVA tenderness or left CVA tenderness.  Skin:    General: Skin is warm and dry.  Neurological:     General: No focal deficit present.     Mental Status: She is alert and oriented to person, place, and time.  Psychiatric:        Mood and Affect: Mood normal.        Behavior: Behavior normal.      UC Treatments / Results  Labs (all labs ordered are listed, but only abnormal results are displayed) Labs Reviewed  HIV ANTIBODY (ROUTINE TESTING W REFLEX)  RPR  CERVICOVAGINAL ANCILLARY ONLY    EKG   Radiology No results found.  Procedures Procedures (including critical care time)  Medications Ordered in UC Medications - No data to display  Initial Impression / Assessment and Plan / UC Course  I have reviewed the triage vital signs and the nursing notes.  Pertinent  labs & imaging results that were available during my care of the patient were reviewed by me and considered in my medical decision making (see chart for details).     Reviewed exam and symptoms with patient.  No red flags on exam. STD testing is ordered.  Patient wants to wait for results prior to initiating any treatment for vaginal discharge Follow-up with PCP or GYN as needed ER precautions reviewed and patient verbalized understanding Final Clinical Impressions(s) / UC Diagnoses   Final diagnoses:  Screening examination for STD (sexually transmitted disease)  Vaginal discharge     Discharge Instructions      The clinic will contact you with results of the testing done today if any treatment is indicated Follow-up with your PCP as needed     ED Prescriptions   None    PDMP not reviewed this encounter.   Melynda Ripple, NP 07/12/22 1309

## 2022-07-12 NOTE — Discharge Instructions (Signed)
The clinic will contact you with results of the testing done today if any treatment is indicated Follow-up with your PCP as needed 

## 2022-07-13 ENCOUNTER — Telehealth (HOSPITAL_COMMUNITY): Payer: Self-pay | Admitting: Emergency Medicine

## 2022-07-13 LAB — CERVICOVAGINAL ANCILLARY ONLY
Bacterial Vaginitis (gardnerella): POSITIVE — AB
Candida Glabrata: NEGATIVE
Candida Vaginitis: NEGATIVE
Chlamydia: POSITIVE — AB
Comment: NEGATIVE
Comment: NEGATIVE
Comment: NEGATIVE
Comment: NEGATIVE
Comment: NEGATIVE
Comment: NORMAL
Neisseria Gonorrhea: NEGATIVE
Trichomonas: NEGATIVE

## 2022-07-13 MED ORDER — METRONIDAZOLE 0.75 % VA GEL: 1.0000 | Freq: Every day | VAGINAL | 0 refills | Status: AC

## 2022-07-13 MED ORDER — DOXYCYCLINE HYCLATE 100 MG PO CAPS: 100.0000 mg | ORAL_CAPSULE | Freq: Two times a day (BID) | ORAL | 0 refills | Status: AC

## 2022-07-14 LAB — RPR, QUANT+TP ABS (REFLEX)
Rapid Plasma Reagin, Quant: 1:1 {titer} — ABNORMAL HIGH
T Pallidum Abs: NONREACTIVE

## 2022-07-14 LAB — RPR: RPR Ser Ql: REACTIVE — AB

## 2022-07-14 LAB — HIV ANTIBODY (ROUTINE TESTING W REFLEX): HIV Screen 4th Generation wRfx: NONREACTIVE

## 2022-07-29 ENCOUNTER — Ambulatory Visit (INDEPENDENT_AMBULATORY_CARE_PROVIDER_SITE_OTHER): Payer: Medicaid Other

## 2022-07-29 ENCOUNTER — Other Ambulatory Visit (HOSPITAL_COMMUNITY)
Admission: RE | Admit: 2022-07-29 | Discharge: 2022-07-29 | Disposition: A | Discharge status: Home / Self Care | Payer: Medicaid Other | Source: Ambulatory Visit | Attending: Obstetrics and Gynecology | Admitting: Obstetrics and Gynecology

## 2022-07-29 DIAGNOSIS — Z3201 Encounter for pregnancy test, result positive: Secondary | ICD-10-CM

## 2022-07-29 DIAGNOSIS — N898 Other specified noninflammatory disorders of vagina: Secondary | ICD-10-CM | POA: Diagnosis present

## 2022-07-29 DIAGNOSIS — Z3200 Encounter for pregnancy test, result unknown: Secondary | ICD-10-CM

## 2022-07-29 LAB — POCT URINE PREGNANCY: Preg Test, Ur: POSITIVE — AB

## 2022-07-29 MED ORDER — PRENATAL 28-0.8 MG PO TABS: 1.0000 | ORAL_TABLET | Freq: Every day | ORAL | 11 refills | Status: DC

## 2022-07-29 NOTE — Progress Notes (Signed)
..  Ms. Clinch presents today for UPT. She complains of vaginal discharge and irritation LMP:06/25/2022    OBJECTIVE: Appears well, in no apparent distress.  OB History     Gravida  1   Para      Term      Preterm      AB      Living  0      SAB      IAB      Ectopic      Multiple      Live Births             Home UPT Result:Positive In-Office UPT result:Positive I have reviewed the patient's medical, obstetrical, social, and family histories, and medications.   ASSESSMENT: Positive pregnancy test Vaginal discharge and irritation  PLAN Prenatal care to be completed at: [facility] sent to pharmacy Safe med list provided Self swab performed

## 2022-08-01 ENCOUNTER — Ambulatory Visit: Payer: Medicaid Other | Admitting: Medical

## 2022-08-01 ENCOUNTER — Encounter: Payer: Self-pay | Admitting: Obstetrics and Gynecology

## 2022-08-01 DIAGNOSIS — A568 Sexually transmitted chlamydial infection of other sites: Secondary | ICD-10-CM | POA: Insufficient documentation

## 2022-08-01 LAB — CERVICOVAGINAL ANCILLARY ONLY
Bacterial Vaginitis (gardnerella): POSITIVE — AB
Candida Glabrata: NEGATIVE
Candida Vaginitis: POSITIVE — AB
Chlamydia: POSITIVE — AB
Comment: NEGATIVE
Comment: NEGATIVE
Comment: NEGATIVE
Comment: NEGATIVE
Comment: NEGATIVE
Comment: NORMAL
Neisseria Gonorrhea: NEGATIVE
Trichomonas: NEGATIVE

## 2022-08-02 ENCOUNTER — Other Ambulatory Visit: Payer: Self-pay

## 2022-08-02 ENCOUNTER — Other Ambulatory Visit: Payer: Self-pay | Admitting: Obstetrics & Gynecology

## 2022-08-02 DIAGNOSIS — B3731 Acute candidiasis of vulva and vagina: Secondary | ICD-10-CM

## 2022-08-02 DIAGNOSIS — B9689 Other specified bacterial agents as the cause of diseases classified elsewhere: Secondary | ICD-10-CM

## 2022-08-02 DIAGNOSIS — A749 Chlamydial infection, unspecified: Secondary | ICD-10-CM

## 2022-08-02 MED ORDER — AZITHROMYCIN 500 MG PO TABS: 1000.0000 mg | ORAL_TABLET | Freq: Once | ORAL | 0 refills | Status: AC

## 2022-08-02 MED ORDER — TERCONAZOLE 0.8 % VA CREA: 1.0000 | TOPICAL_CREAM | Freq: Every day | VAGINAL | 0 refills | Status: DC

## 2022-08-02 MED ORDER — METRONIDAZOLE 500 MG PO TABS: 500.0000 mg | ORAL_TABLET | Freq: Two times a day (BID) | ORAL | 0 refills | Status: DC

## 2022-08-03 ENCOUNTER — Inpatient Hospital Stay (HOSPITAL_COMMUNITY): Payer: Medicaid Other

## 2022-08-03 ENCOUNTER — Telehealth: Payer: Self-pay

## 2022-08-03 ENCOUNTER — Inpatient Hospital Stay (HOSPITAL_COMMUNITY)
Admission: AD | Admit: 2022-08-03 | Discharge: 2022-08-03 | Disposition: A | Discharge status: Home / Self Care | Payer: Medicaid Other | Attending: Obstetrics and Gynecology | Admitting: Obstetrics and Gynecology

## 2022-08-03 DIAGNOSIS — R102 Pelvic and perineal pain: Secondary | ICD-10-CM | POA: Insufficient documentation

## 2022-08-03 DIAGNOSIS — B9689 Other specified bacterial agents as the cause of diseases classified elsewhere: Secondary | ICD-10-CM | POA: Diagnosis not present

## 2022-08-03 DIAGNOSIS — O26851 Spotting complicating pregnancy, first trimester: Secondary | ICD-10-CM | POA: Insufficient documentation

## 2022-08-03 DIAGNOSIS — O209 Hemorrhage in early pregnancy, unspecified: Secondary | ICD-10-CM

## 2022-08-03 DIAGNOSIS — O3680X0 Pregnancy with inconclusive fetal viability, not applicable or unspecified: Secondary | ICD-10-CM

## 2022-08-03 DIAGNOSIS — Z3A01 Less than 8 weeks gestation of pregnancy: Secondary | ICD-10-CM

## 2022-08-03 DIAGNOSIS — Z6790 Unspecified blood type, Rh positive: Secondary | ICD-10-CM

## 2022-08-03 DIAGNOSIS — O98811 Other maternal infectious and parasitic diseases complicating pregnancy, first trimester: Secondary | ICD-10-CM | POA: Diagnosis not present

## 2022-08-03 DIAGNOSIS — O26891 Other specified pregnancy related conditions, first trimester: Secondary | ICD-10-CM | POA: Insufficient documentation

## 2022-08-03 DIAGNOSIS — O2341 Unspecified infection of urinary tract in pregnancy, first trimester: Secondary | ICD-10-CM

## 2022-08-03 DIAGNOSIS — A749 Chlamydial infection, unspecified: Secondary | ICD-10-CM | POA: Diagnosis not present

## 2022-08-03 LAB — URINALYSIS, ROUTINE W REFLEX MICROSCOPIC
Bilirubin Urine: NEGATIVE
Glucose, UA: NEGATIVE mg/dL
Ketones, ur: NEGATIVE mg/dL
Nitrite: POSITIVE — AB
Protein, ur: NEGATIVE mg/dL
Specific Gravity, Urine: 1.02 (ref 1.005–1.030)
pH: 6.5 (ref 5.0–8.0)

## 2022-08-03 LAB — ABO/RH: ABO/RH(D): O POS

## 2022-08-03 LAB — CBC
HCT: 37.4 % (ref 36.0–46.0)
Hemoglobin: 12.2 g/dL (ref 12.0–15.0)
MCH: 29.7 pg (ref 26.0–34.0)
MCHC: 32.6 g/dL (ref 30.0–36.0)
MCV: 91 fL (ref 80.0–100.0)
Platelets: 268 10*3/uL (ref 150–400)
RBC: 4.11 MIL/uL (ref 3.87–5.11)
RDW: 12.9 % (ref 11.5–15.5)
WBC: 3.4 10*3/uL — ABNORMAL LOW (ref 4.0–10.5)
nRBC: 0 % (ref 0.0–0.2)

## 2022-08-03 LAB — URINALYSIS, MICROSCOPIC (REFLEX)

## 2022-08-03 LAB — HCG, QUANTITATIVE, PREGNANCY: hCG, Beta Chain, Quant, S: 1162 m[IU]/mL — ABNORMAL HIGH (ref <5)

## 2022-08-03 MED ORDER — AZITHROMYCIN 250 MG PO TABS
1000.0000 mg | ORAL_TABLET | Freq: Once | ORAL | Status: AC
  Administered 2022-08-03: 1000 mg via ORAL
  Filled 2022-08-03: qty 4

## 2022-08-03 MED ORDER — CEFADROXIL 500 MG PO CAPS: 500.0000 mg | ORAL_CAPSULE | Freq: Two times a day (BID) | ORAL | 0 refills | Status: AC

## 2022-08-03 NOTE — MAU Note (Signed)
...  Patricia Sullivan is a 22 y.o. at 1w4dhere in MAU reporting: Heavier VB that began last night. She reports prior to this she was experiencing vaginal spotting for two days. She reports this morning she passed a blood clot this morning at 0830. She reports her bleeding has decreased and she mainly sees the blood when she wipes. Last IC two days ago.  Pain score: 5/10 lower abdomen  Lab orders placed from triage:  UA

## 2022-08-03 NOTE — Telephone Encounter (Signed)
Returned call, no answer, left vm 

## 2022-08-03 NOTE — Telephone Encounter (Signed)
Pt called back, stated that she is having red bleeding, heavy like a menstrual flow with clots and cramping. Advised to be evaluated at MAU immediately, pt agreed.

## 2022-08-03 NOTE — MAU Provider Note (Signed)
History     CSN: [id number]  Arrival date and time: 08/03/22 1213   Event Date/Time   First Provider Initiated Contact with Patient 08/03/22 1300      Chief Complaint  Patient presents with   Vaginal Bleeding   Abdominal Pain   HPI Patricia Sullivan is a 22 y.o. G1P0 at 21w4dby LMP who presents to MAU for vaginal bleeding and pelvic pain. She reports that she has had spotting for a few days however last night it became heavy to the point that she was soaking through pads. She reports bleeding has since eased up and is only with wiping. She did report passing a tiny clot this morning as well. She reports pelvic pain started last night. She was recently diagnosed with chlamydia, yeast and BV, but has not taken her medications. She denies urinary s/s. LMP 06/24/2022.  [facility]   OB History     Gravida  1   Para      Term      Preterm      AB      Living  0      SAB      IAB      Ectopic      Multiple      Live Births              Past Medical History:  Diagnosis Date   Horseshoe kidney     No past surgical history on file.  Family History  Problem Relation Age of Onset   Healthy Mother    Healthy Father     Social History   Tobacco Use   Smoking status: Never   Smokeless tobacco: Never  Substance Use Topics   Alcohol use: Yes    Comment: Occasionally    Drug use: Yes    Types: Marijuana    Comment: Occasionally    Allergies: No Known Allergies  No medications prior to admission.   Review of Systems  Constitutional: Negative.   Gastrointestinal:  Positive for abdominal distention (cramping).  Genitourinary:  Positive for pelvic pain and vaginal bleeding.  All other systems reviewed and are negative.  Physical Exam   Blood pressure (!) 86/58, pulse (!) 104, temperature 97.9 F (36.6 C), temperature source Oral, resp. rate 17, height '5\' 3"'$  (1.6 m), weight 51.9 kg, last menstrual period 06/25/2022, SpO2 96  %.  Physical Exam Vitals and nursing note reviewed.  Constitutional:      General: She is not in acute distress. Cardiovascular:     Rate and Rhythm: Tachycardia present.  Pulmonary:     Effort: Pulmonary effort is normal. No respiratory distress.  Abdominal:     Palpations: Abdomen is soft.     Tenderness: There is no abdominal tenderness.  Skin:    General: Skin is warm and dry.  Neurological:     General: No focal deficit present.     Mental Status: She is alert and oriented to person, place, and time.  Psychiatric:        Mood and Affect: Mood normal.        Behavior: Behavior normal.    Results for orders placed or performed during the hospital encounter of 08/03/22 (from the past 24 hour(s))  Urinalysis, Routine w reflex microscopic -Urine, Clean Catch     Status: Abnormal   Collection Time: 08/03/22 12:36 PM  Result Value Ref Range   Color, Urine YELLOW YELLOW   APPearance CLOUDY (A)  CLEAR   Specific Gravity, Urine 1.020 1.005 - 1.030   pH 6.5 5.0 - 8.0   Glucose, UA NEGATIVE NEGATIVE mg/dL   Hgb urine dipstick MODERATE (A) NEGATIVE   Bilirubin Urine NEGATIVE NEGATIVE   Ketones, ur NEGATIVE NEGATIVE mg/dL   Protein, ur NEGATIVE NEGATIVE mg/dL   Nitrite POSITIVE (A) NEGATIVE   Leukocytes,Ua SMALL (A) NEGATIVE  Urinalysis, Microscopic (reflex)     Status: Abnormal   Collection Time: 08/03/22 12:36 PM  Result Value Ref Range   RBC / HPF 0-5 0 - 5 RBC/hpf   WBC, UA 6-10 0 - 5 WBC/hpf   Bacteria, UA MANY (A) NONE SEEN   Squamous Epithelial / HPF 0-5 0 - 5 /HPF   Mucus PRESENT   ABO/Rh     Status: None   Collection Time: 08/03/22  1:08 PM  Result Value Ref Range   ABO/RH(D) O POS    No rh immune globuloin      NOT A RH IMMUNE GLOBULIN CANDIDATE, PT RH POSITIVE Performed at [facility], [place] [address], [place], [place] [address]   CBC     Status: Abnormal   Collection Time: 08/03/22  1:09 PM  Result Value Ref Range   WBC 3.4 (L) 4.0 - 10.5 K/uL   RBC  4.11 3.87 - 5.11 MIL/uL   Hemoglobin 12.2 12.0 - 15.0 g/dL   HCT 37.4 36.0 - 46.0 %   MCV 91.0 80.0 - 100.0 fL   MCH 29.7 26.0 - 34.0 pg   MCHC 32.6 30.0 - 36.0 g/dL   RDW 12.9 11.5 - 15.5 %   Platelets 268 150 - 400 K/uL   nRBC 0.0 0.0 - 0.2 %  hCG, quantitative, pregnancy     Status: Abnormal   Collection Time: 08/03/22  1:09 PM  Result Value Ref Range   hCG, Beta Chain, Quant, S 1,162 (H) <5 mIU/mL   US OB LESS THAN 14 WEEKS WITH OB TRANSVAGINAL  Result Date: 08/03/2022 CLINICAL DATA:  Vaginal bleeding since last night. Pregnant patient. EXAM: OBSTETRIC <14 WK [place] AND TRANSVAGINAL OB US TECHNIQUE: Both transabdominal and transvaginal ultrasound examinations were performed for complete evaluation of the gestation as well as the maternal uterus, adnexal regions, and pelvic cul-de-sac. Transvaginal technique was performed to assess early pregnancy. COMPARISON:  None Available. FINDINGS: Intrauterine gestational sac: None Maternal uterus/adnexae: Adnexa are unremarkable. IMPRESSION: 1. No cause for symptoms identified. The lack of an IUP in a pregnant patient may be due to early pregnancy, recent miscarriage, or ectopic pregnancy. Recommend close clinical follow-up and follow-up imaging as clinically warranted. Electronically Signed   By: Dorise Bullion III M.D.   On: 08/03/2022 13:37    MAU Course  Procedures  MDM UA, culture CBC, HCG, ABO/RH [place]  UA positive nitrites and bacteria, culture added  Blood type O positive, Rhogam not indicated Labs reassuring- CBC normal [place] without evidence of IUP or ectopic Patient had positive chlamydia test on 2/23 however has not taken medications. Azithromycin given here  Assessment and Plan   1. Pregnancy of unknown anatomic location   2. Vaginal bleeding in pregnancy, first trimester   3. Chlamydia infection affecting pregnancy in first trimester   4. Urinary tract infection in mother during first trimester of pregnancy   5. Blood type, Rh  positive    - Discharge home in stable condition - Return to MAU in 48 hours for bHCG. Return sooner for new/worsening symptoms - Duricef sent to pharmacy - Reviewed  importance of patient notifying partner(s) of positive chlamydia and necessity of treatment- abstain from intercourse for 7 days after all parties have been treated   Renee Harder, [place] 08/03/2022, 2:53 PM

## 2022-08-05 ENCOUNTER — Other Ambulatory Visit: Payer: Self-pay

## 2022-08-05 ENCOUNTER — Inpatient Hospital Stay (HOSPITAL_COMMUNITY)
Admission: AD | Admit: 2022-08-05 | Discharge: 2022-08-05 | Disposition: A | Discharge status: Home / Self Care | Payer: Medicaid Other | Attending: Obstetrics & Gynecology | Admitting: Obstetrics & Gynecology

## 2022-08-05 ENCOUNTER — Inpatient Hospital Stay (HOSPITAL_COMMUNITY)
Admit: 2022-08-05 | Discharge: 2022-08-05 | Disposition: A | Discharge status: Home / Self Care | Payer: Medicaid Other | Attending: Obstetrics and Gynecology | Admitting: Obstetrics and Gynecology

## 2022-08-05 DIAGNOSIS — O3680X0 Pregnancy with inconclusive fetal viability, not applicable or unspecified: Secondary | ICD-10-CM | POA: Diagnosis not present

## 2022-08-05 DIAGNOSIS — Z3A01 Less than 8 weeks gestation of pregnancy: Secondary | ICD-10-CM

## 2022-08-05 DIAGNOSIS — O26851 Spotting complicating pregnancy, first trimester: Secondary | ICD-10-CM | POA: Insufficient documentation

## 2022-08-05 LAB — HCG, QUANTITATIVE, PREGNANCY: hCG, Beta Chain, Quant, S: 1462 m[IU]/mL — ABNORMAL HIGH (ref <5)

## 2022-08-05 NOTE — MAU Note (Signed)
Patricia Sullivan is a 22 y.o. at 78w6dhere in MAU reporting: here for HCG level.  Denies current pain, endorses spotting. LMP: NA Onset of complaint: NA Pain score: 0 Vitals:   08/05/22 1329  BP: 114/70  Pulse: 100  Resp: 18  Temp: 98.4 F (36.9 C)  SpO2: 100%     FHT:NA Lab orders placed from triage:   HCG Level

## 2022-08-05 NOTE — MAU Provider Note (Signed)
   S Ms. Patricia Sullivan is a 22 y.o. G1P0 patient who presents to MAU today for repeat bHCG. She reports still having some spotting but pain has resolved.   O BP 114/70 (BP Location: Right Arm)   Pulse 100   Temp 98.4 F (36.9 C) (Oral)   Resp 18   Ht '5\' 3"'$  (1.6 m)   Wt 52.6 kg   LMP 06/25/2022   SpO2 100%   BMI 20.55 kg/m   Physical Exam Vitals and nursing note reviewed.  Constitutional:      General: She is not in acute distress. Eyes:     Extraocular Movements: Extraocular movements intact.     Pupils: Pupils are equal, round, and reactive to light.  Cardiovascular:     Rate and Rhythm: Normal rate.  Pulmonary:     Effort: Pulmonary effort is normal.  Musculoskeletal:        General: Normal range of motion.  Skin:    General: Skin is warm and dry.  Neurological:     General: No focal deficit present.     Mental Status: She is alert and oriented to person, place, and time.    2/28: HCG 1,162 3/1: HCG 1,462   A Medical screening exam complete 1. Pregnancy of unknown anatomic location     P Inappropriate rise in HCG. Return to MAU in 48 hours for repeat HCG Discharge from MAU in stable condition Strict return precautions. Return to MAU sooner for new/worsening symptoms   Renee Harder, CNM 08/05/2022 4:32 PM

## 2022-08-06 LAB — CULTURE, OB URINE: Culture: >=100000 — AB

## 2022-08-07 ENCOUNTER — Other Ambulatory Visit: Payer: Self-pay

## 2022-08-07 ENCOUNTER — Inpatient Hospital Stay (HOSPITAL_COMMUNITY)
Admission: AD | Admit: 2022-08-07 | Discharge: 2022-08-07 | Disposition: A | Discharge status: Home / Self Care | Payer: Medicaid Other | Attending: Obstetrics & Gynecology | Admitting: Obstetrics & Gynecology

## 2022-08-07 ENCOUNTER — Inpatient Hospital Stay (HOSPITAL_COMMUNITY): Payer: Medicaid Other

## 2022-08-07 ENCOUNTER — Other Ambulatory Visit (HOSPITAL_COMMUNITY)
Admission: AD | Admit: 2022-08-07 | Discharge: 2022-08-07 | Disposition: A | Discharge status: Home / Self Care | Payer: Medicaid Other | Attending: Obstetrics & Gynecology | Admitting: Obstetrics & Gynecology

## 2022-08-07 DIAGNOSIS — Z6740 Type O blood, Rh positive: Secondary | ICD-10-CM

## 2022-08-07 DIAGNOSIS — Z5181 Encounter for therapeutic drug level monitoring: Secondary | ICD-10-CM

## 2022-08-07 DIAGNOSIS — Z3A01 Less than 8 weeks gestation of pregnancy: Secondary | ICD-10-CM | POA: Insufficient documentation

## 2022-08-07 DIAGNOSIS — O3680X0 Pregnancy with inconclusive fetal viability, not applicable or unspecified: Secondary | ICD-10-CM | POA: Diagnosis present

## 2022-08-07 DIAGNOSIS — O009 Unspecified ectopic pregnancy without intrauterine pregnancy: Secondary | ICD-10-CM | POA: Diagnosis not present

## 2022-08-07 LAB — COMPREHENSIVE METABOLIC PANEL
ALT: 12 U/L (ref 0–44)
AST: 19 U/L (ref 15–41)
Albumin: 3.8 g/dL (ref 3.5–5.0)
Alkaline Phosphatase: 40 U/L (ref 38–126)
Anion gap: 4 — ABNORMAL LOW (ref 5–15)
BUN: 8 mg/dL (ref 6–20)
CO2: 28 mmol/L (ref 22–32)
Calcium: 9.2 mg/dL (ref 8.9–10.3)
Chloride: 104 mmol/L (ref 98–111)
Creatinine, Ser: 0.76 mg/dL (ref 0.44–1.00)
GFR, Estimated: >60 mL/min (ref >60)
Glucose, Bld: 78 mg/dL (ref 70–99)
Potassium: 4.3 mmol/L (ref 3.5–5.1)
Sodium: 136 mmol/L (ref 135–145)
Total Bilirubin: 0.9 mg/dL (ref 0.3–1.2)
Total Protein: 6.7 g/dL (ref 6.5–8.1)

## 2022-08-07 LAB — HCG, QUANTITATIVE, PREGNANCY: hCG, Beta Chain, Quant, S: 1626 m[IU]/mL — ABNORMAL HIGH (ref <5)

## 2022-08-07 MED ORDER — METHOTREXATE FOR ECTOPIC PREGNANCY
50.0000 mg/m2 | Freq: Once | INTRAMUSCULAR | Status: AC
  Administered 2022-08-07: 75 mg via INTRAMUSCULAR
  Filled 2022-08-07: qty 3

## 2022-08-07 NOTE — MAU Provider Note (Signed)
History     CSN: [id number]  Arrival date and time: 08/07/22 1354   None     Chief Complaint  Patient presents with  . Follow-up   Patricia Sullivan is a 22 y.o. G1P0 at 75w1dwho presents today for repeat HCG. [facility] She was having pain and bleeding, but she denies any pain or bleeding today.   Vaginal Bleeding The patient's primary symptoms include vaginal bleeding. This is a new problem. The current episode started in the past 7 days. The problem has been resolved. The patient is experiencing no pain. She is pregnant. There has been no bleeding. Menstrual history: LMP 06/25/2022.    OB History     Gravida  1   Para      Term      Preterm      AB      Living  0      SAB      IAB      Ectopic      Multiple      Live Births              Past Medical History:  Diagnosis Date  . Horseshoe kidney     No past surgical history on file.  Family History  Problem Relation Age of Onset  . Healthy Mother   . Healthy Father     Social History   Tobacco Use  . Smoking status: Never  . Smokeless tobacco: Never  Substance Use Topics  . Alcohol use: Yes    Comment: Occasionally   . Drug use: Yes    Types: Marijuana    Comment: Occasionally    Allergies: No Known Allergies  Medications Prior to Admission  Medication Sig Dispense Refill Last Dose  . cefadroxil (DURICEF) 500 MG capsule Take 1 capsule (500 mg total) by mouth 2 (two) times daily for 7 days. 14 capsule 0   . fluticasone (FLONASE) 50 MCG/ACT nasal spray Place 1 spray into both nostrils daily for 3 days. 16 g 0   . metroNIDAZOLE (FLAGYL) 500 MG tablet Take 1 tablet (500 mg total) by mouth 2 (two) times daily. 14 tablet 0   . ondansetron (ZOFRAN) 4 MG tablet Take 1 tablet (4 mg total) by mouth every 8 (eight) hours as needed for nausea or vomiting. (Patient not taking: Reported on 07/29/2022) 20 tablet 0   . Prenatal 28-0.8 MG TABS Take 1  tablet by mouth daily. 30 tablet 11   . terconazole (TERAZOL 3) 0.8 % vaginal cream Place 1 applicator vaginally at bedtime. Apply nightly for three nights. 20 g 0     Review of Systems  Genitourinary:  Positive for vaginal bleeding.  All other systems reviewed and are negative.  Physical Exam   Blood pressure 108/64, pulse 97, temperature 99.2 F (37.3 C), temperature source Oral, resp. rate 16, height '5\' 3"'$  (1.6 m), weight 52.3 kg, last menstrual period 06/25/2022, SpO2 100 %.  Physical Exam Vitals and nursing note reviewed.  Constitutional:      Appearance: She is well-developed.  HENT:     Head: Normocephalic.  Eyes:     Pupils: Pupils are equal, round, and reactive to light.  Cardiovascular:     Rate and Rhythm: Normal rate.  Pulmonary:     Effort: Pulmonary effort is normal. No respiratory distress.  Abdominal:     Palpations: Abdomen is soft.     Tenderness: There is no  abdominal tenderness.  Genitourinary:    Vagina: No bleeding.     Comments:    Musculoskeletal:        General: Normal range of motion.     Cervical back: Normal range of motion and neck supple.  Skin:    General: Skin is warm and dry.  Neurological:     Mental Status: She is alert and oriented to person, place, and time.  Psychiatric:        Mood and Affect: Mood normal.        Behavior: Behavior normal.    Latest Reference Range & Units 08/03/22 13:09 08/05/22 13:30 08/07/22 14:15  HCG, Beta Chain, Quant, S <5 mIU/mL 1,162 (H) 1,462 (H) 1,626 (H)  (H): Data is abnormally high   Results for orders placed or performed during the hospital encounter of 08/07/22 (from the past 24 hour(s))  hCG, quantitative, pregnancy     Status: Abnormal   Collection Time: 08/07/22  2:15 PM  Result Value Ref Range   hCG, Beta Chain, Quant, S 1,626 (H) <5 mIU/mL  Comprehensive metabolic panel     Status: Abnormal   Collection Time: 08/07/22  2:52 PM  Result Value Ref Range   Sodium 136 135 - 145 mmol/L    Potassium 4.3 3.5 - 5.1 mmol/L   Chloride 104 98 - 111 mmol/L   CO2 28 22 - 32 mmol/L   Glucose, Bld 78 70 - 99 mg/dL   BUN 8 6 - 20 mg/dL   Creatinine, Ser 0.76 0.44 - 1.00 mg/dL   Calcium 9.2 8.9 - 10.3 mg/dL   Total Protein 6.7 6.5 - 8.1 g/dL   Albumin 3.8 3.5 - 5.0 g/dL   AST 19 15 - 41 U/L   ALT 12 0 - 44 U/L   Alkaline Phosphatase 40 38 - 126 U/L   Total Bilirubin 0.9 0.3 - 1.2 mg/dL   GFR, Estimated >60 >60 mL/min   Anion gap 4 (L) 5 - 15    US OB Transvaginal  Result Date: 08/07/2022 CLINICAL DATA:  Rising beta HCG EXAM: TRANSVAGINAL OB ULTRASOUND TECHNIQUE: Transvaginal ultrasound was performed for complete evaluation of the gestation as well as the maternal uterus, adnexal regions, and pelvic cul-de-sac. COMPARISON:  08/03/2022 ultrasound FINDINGS: Intrauterine gestational sac: None Yolk sac:  None Embryo:  None Cardiac Activity: None Subchorionic hemorrhage:  None appropriate Maternal uterus/adnexae: Slightly heterogeneous myometrium. Endometrial stripe measures 2 mm. Trace pelvic free fluid. The left ovary has small follicles and measures 4.0 x 2.5 x 2.2 cm. Right ovary appears similar and measures 3.5 x 2.2 x 1.7 cm. Blood flow seen to both ovaries on color Doppler. Of note the sonographer did not use color Doppler on the area of the endometrium this time. IMPRESSION: Unremarkable endovaginal pelvic ultrasound. No IUP or free fluid. With the patient's persistent beta HCG, ectopic is not excluded. Recommend continued close follow-up Electronically Signed   By: Jill Side M.D.   On: 08/07/2022 16:56    MAU Course  Procedures  MDM HCG continues to plateau today. Will get [place] to assess for any possible visualization of ectopic or ruptured ectopic pregnancy.  1716: DW Dr. Damita Dunnings, recommends MTX today.  1750: Discussed with patient and family, and answered questions. Patient wanted some time to discuss with her parents. Patient given time to discuss with family, and agrees to  proceeding with MTX today.  O pos blood type, no need for rhogam  Appt scheduled for [facility] office on 08/10/2022 at  1:00pm for day #4 Hcg    Assessment and Plan   1. Ectopic pregnancy without intrauterine pregnancy, unspecified location   2. Type O blood, Rh positive   3. Encounter for monitoring of methotrexate therapy    DC home in stable condition  Bleeding precautions Ectopic precautions RX: none  Return to MAU as needed FU with OB as planned   Follow-up Information     [facility] Follow up.   Why: Wednesday 08/10/2022 at 1:00 for HCG level Contact information: [address] [place] [phone] [place] DNP, CNM  08/07/22  6:04 PM

## 2022-08-07 NOTE — MAU Note (Signed)
Patricia Sullivan is a 22 y.o. at 40w1dhere in MAU reporting: here for follow up hcg. No pain or bleeding.  Onset of complaint: ongoing  Pain score: 0/10  Vitals:   08/07/22 1409  BP: 108/64  Pulse: 97  Resp: 16  Temp: 99.2 F (37.3 C)  SpO2: 100%     FHT:NA  Lab orders placed from triage: hcg

## 2022-08-10 ENCOUNTER — Ambulatory Visit (INDEPENDENT_AMBULATORY_CARE_PROVIDER_SITE_OTHER): Payer: Medicaid Other

## 2022-08-10 VITALS — BP 111/70 | HR 93

## 2022-08-10 DIAGNOSIS — O0091 Unspecified ectopic pregnancy with intrauterine pregnancy: Secondary | ICD-10-CM

## 2022-08-10 DIAGNOSIS — O009 Unspecified ectopic pregnancy without intrauterine pregnancy: Secondary | ICD-10-CM

## 2022-08-10 DIAGNOSIS — Z3A00 Weeks of gestation of pregnancy not specified: Secondary | ICD-10-CM

## 2022-08-10 NOTE — Progress Notes (Signed)
..   History   Chief Complaint:  STAT HCG   Patricia Sullivan is  22 y.o. G1P0 Patient's last menstrual period was 06/25/2022.Marland Kitchen Patient is here for follow up of quantitative HCG and ongoing surveillance of pregnancy status.   She reports receiving Methotrexate at MAU on 08/07/22.  Since her last visit, the patient is without new complaint.   The patient reports bleeding as  none now, and denies any pain.   Previous Quantitative HCG values are: 2/28- 1,162 3/1- 1,462  3/3- 1,626   Physical Exam  Last menstrual period 06/25/2022. Focused Gynecological Exam:  examination not indicated  Labs: No results found for this or any previous visit (from the past 24 hour(s)).  Ultrasound Studies:   US OB Transvaginal  Result Date: 08/07/2022 CLINICAL DATA:  Rising beta HCG EXAM: TRANSVAGINAL OB ULTRASOUND TECHNIQUE: Transvaginal ultrasound was performed for complete evaluation of the gestation as well as the maternal uterus, adnexal regions, and pelvic cul-de-sac. COMPARISON:  08/03/2022 ultrasound FINDINGS: Intrauterine gestational sac: None Yolk sac:  None Embryo:  None Cardiac Activity: None Subchorionic hemorrhage:  None appropriate Maternal uterus/adnexae: Slightly heterogeneous myometrium. Endometrial stripe measures 2 mm. Trace pelvic free fluid. The left ovary has small follicles and measures 4.0 x 2.5 x 2.2 cm. Right ovary appears similar and measures 3.5 x 2.2 x 1.7 cm. Blood flow seen to both ovaries on color Doppler. Of note the sonographer did not use color Doppler on the area of the endometrium this time. IMPRESSION: Unremarkable endovaginal pelvic ultrasound. No IUP or free fluid. With the patient's persistent beta HCG, ectopic is not excluded. Recommend continued close follow-up Electronically Signed   By: Jill Side M.D.   On: 08/07/2022 16:56   US OB LESS THAN 14 WEEKS WITH OB TRANSVAGINAL  Result Date: 08/03/2022 CLINICAL DATA:  Vaginal bleeding since last night. Pregnant patient. EXAM:  OBSTETRIC <14 WK [place] AND TRANSVAGINAL OB US TECHNIQUE: Both transabdominal and transvaginal ultrasound examinations were performed for complete evaluation of the gestation as well as the maternal uterus, adnexal regions, and pelvic cul-de-sac. Transvaginal technique was performed to assess early pregnancy. COMPARISON:  None Available. FINDINGS: Intrauterine gestational sac: None Maternal uterus/adnexae: Adnexa are unremarkable. IMPRESSION: 1. No cause for symptoms identified. The lack of an IUP in a pregnant patient may be due to early pregnancy, recent miscarriage, or ectopic pregnancy. Recommend close clinical follow-up and follow-up imaging as clinically warranted. Electronically Signed   By: Dorise Bullion III M.D.   On: 08/03/2022 13:37    Assessment: Here for ongoing surveillance of HCG after Methotrexate on 08/07/22.  Plan: Consulted with Dr. Elgie Congo in office once today's HCG results came in as 1,684. Called pt at 1635 and advised to return to MAU for possible 2nd dose of Methotrexate, pt agreed.

## 2022-08-11 LAB — BETA HCG QUANT (REF LAB): hCG Quant: 1684 m[IU]/mL

## 2022-08-19 ENCOUNTER — Inpatient Hospital Stay (HOSPITAL_COMMUNITY)
Admission: AD | Admit: 2022-08-19 | Discharge: 2022-08-20 | Disposition: A | Discharge status: Home / Self Care | Payer: Medicaid Other | Attending: Obstetrics and Gynecology | Admitting: Obstetrics and Gynecology

## 2022-08-19 DIAGNOSIS — Z362 Encounter for other antenatal screening follow-up: Secondary | ICD-10-CM | POA: Diagnosis present

## 2022-08-19 DIAGNOSIS — Z79631 Long term (current) use of antimetabolite agent: Secondary | ICD-10-CM

## 2022-08-19 DIAGNOSIS — O009 Unspecified ectopic pregnancy without intrauterine pregnancy: Secondary | ICD-10-CM | POA: Diagnosis present

## 2022-08-19 DIAGNOSIS — O0091 Unspecified ectopic pregnancy with intrauterine pregnancy: Secondary | ICD-10-CM | POA: Diagnosis not present

## 2022-08-19 DIAGNOSIS — Z3A08 8 weeks gestation of pregnancy: Secondary | ICD-10-CM | POA: Diagnosis not present

## 2022-08-19 LAB — COMPREHENSIVE METABOLIC PANEL
ALT: 18 U/L (ref 0–44)
AST: 22 U/L (ref 15–41)
Albumin: 3.5 g/dL (ref 3.5–5.0)
Alkaline Phosphatase: 34 U/L — ABNORMAL LOW (ref 38–126)
Anion gap: 9 (ref 5–15)
BUN: 10 mg/dL (ref 6–20)
CO2: 25 mmol/L (ref 22–32)
Calcium: 9 mg/dL (ref 8.9–10.3)
Chloride: 105 mmol/L (ref 98–111)
Creatinine, Ser: 0.68 mg/dL (ref 0.44–1.00)
GFR, Estimated: >60 mL/min (ref >60)
Glucose, Bld: 80 mg/dL (ref 70–99)
Potassium: 3.7 mmol/L (ref 3.5–5.1)
Sodium: 139 mmol/L (ref 135–145)
Total Bilirubin: 1.3 mg/dL — ABNORMAL HIGH (ref 0.3–1.2)
Total Protein: 6.3 g/dL — ABNORMAL LOW (ref 6.5–8.1)

## 2022-08-19 LAB — CBC
HCT: 33.5 % — ABNORMAL LOW (ref 36.0–46.0)
Hemoglobin: 10.8 g/dL — ABNORMAL LOW (ref 12.0–15.0)
MCH: 30.3 pg (ref 26.0–34.0)
MCHC: 32.2 g/dL (ref 30.0–36.0)
MCV: 93.8 fL (ref 80.0–100.0)
Platelets: 242 10*3/uL (ref 150–400)
RBC: 3.57 MIL/uL — ABNORMAL LOW (ref 3.87–5.11)
RDW: 13.2 % (ref 11.5–15.5)
WBC: 3.6 10*3/uL — ABNORMAL LOW (ref 4.0–10.5)
nRBC: 0 % (ref 0.0–0.2)

## 2022-08-19 LAB — HCG, QUANTITATIVE, PREGNANCY: hCG, Beta Chain, Quant, S: 1063 m[IU]/mL — ABNORMAL HIGH (ref <5)

## 2022-08-19 MED ORDER — METHOTREXATE FOR ECTOPIC PREGNANCY
50.0000 mg/m2 | Freq: Once | INTRAMUSCULAR | Status: AC
  Administered 2022-08-20: 77.5 mg via INTRAMUSCULAR
  Filled 2022-08-19: qty 3.1

## 2022-08-19 NOTE — MAU Provider Note (Incomplete)
  History     CSN: [id number]  Arrival date and time: 08/19/22 2123   Event Date/Time   First Provider Initiated Contact with Patient 08/19/22 2213      Chief Complaint  Patient presents with  . Follow-up   HPI  OB History     Gravida  1   Para      Term      Preterm      AB      Living  0      SAB      IAB      Ectopic      Multiple      Live Births              Past Medical History:  Diagnosis Date  . Horseshoe kidney     No past surgical history on file.  Family History  Problem Relation Age of Onset  . Healthy Mother   . Healthy Father     Social History   Tobacco Use  . Smoking status: Never  . Smokeless tobacco: Never  Substance Use Topics  . Alcohol use: Yes    Comment: Occasionally   . Drug use: Yes    Types: Marijuana    Comment: Occasionally    Allergies: No Known Allergies  Medications Prior to Admission  Medication Sig Dispense Refill Last Dose  . fluticasone (FLONASE) 50 MCG/ACT nasal spray Place 1 spray into both nostrils daily for 3 days. 16 g 0   . metroNIDAZOLE (FLAGYL) 500 MG tablet Take 1 tablet (500 mg total) by mouth 2 (two) times daily. 14 tablet 0   . ondansetron (ZOFRAN) 4 MG tablet Take 1 tablet (4 mg total) by mouth every 8 (eight) hours as needed for nausea or vomiting. (Patient not taking: Reported on 07/29/2022) 20 tablet 0   . Prenatal 28-0.8 MG TABS Take 1 tablet by mouth daily. (Patient not taking: Reported on 08/10/2022) 30 tablet 11   . terconazole (TERAZOL 3) 0.8 % vaginal cream Place 1 applicator vaginally at bedtime. Apply nightly for three nights. (Patient not taking: Reported on 08/10/2022) 20 g 0     Review of Systems Physical Exam   Blood pressure (!) 114/57, pulse 84, temperature 98.7 F (37.1 C), temperature source Oral, resp. rate 17, height 5\' 3"  (1.6 m), weight 53.1 kg, last menstrual period 06/25/2022, SpO2 99 %.  Physical Exam  MAU Course  Procedures  MDM ***  Assessment and  Plan  ***  Patricia Sullivan 08/19/2022, 10:13 PM

## 2022-08-19 NOTE — MAU Provider Note (Signed)
History     CSN: [id number]  Arrival date and time: 08/19/22 2123   Event Date/Time   First Provider Initiated Contact with Patient 08/19/22 2213      Chief Complaint  Patient presents with   Follow-up  Patricia Sullivan is a 22 y.o. G1P0 at [redacted]w[redacted]d who receives care at [facility].  She presents today for follow up.  Patient reports she was suppose to come in for repeat MTX dose on March 6th, but was unable to due to incarceration.  She states she had some bleeding last week that was heavy with some cramping that was worse than normal.  However, she states she has not had any bleeding this week.  She reports that while in jail she had an XRay and thought she saw "a golf ball sized or something in my tube."  However, patient is not having pain.  She does report having unprotected sexual intercourse since being diagnosed with her ectopic pregnancy.   OB History     Gravida  1   Para      Term      Preterm      AB      Living  0      SAB      IAB      Ectopic      Multiple      Live Births              Past Medical History:  Diagnosis Date   Horseshoe kidney     No past surgical history on file.  Family History  Problem Relation Age of Onset   Healthy Mother    Healthy Father     Social History   Tobacco Use   Smoking status: Never   Smokeless tobacco: Never  Substance Use Topics   Alcohol use: Yes    Comment: Occasionally    Drug use: Yes    Types: Marijuana    Comment: Occasionally    Allergies: No Known Allergies  Medications Prior to Admission  Medication Sig Dispense Refill Last Dose   fluticasone (FLONASE) 50 MCG/ACT nasal spray Place 1 spray into both nostrils daily for 3 days. 16 g 0    metroNIDAZOLE (FLAGYL) 500 MG tablet Take 1 tablet (500 mg total) by mouth 2 (two) times daily. 14 tablet 0    ondansetron (ZOFRAN) 4 MG tablet Take 1 tablet (4 mg total) by mouth every 8 (eight) hours as needed for nausea or vomiting. (Patient not taking:  Reported on 07/29/2022) 20 tablet 0    Prenatal 28-0.8 MG TABS Take 1 tablet by mouth daily. (Patient not taking: Reported on 08/10/2022) 30 tablet 11    terconazole (TERAZOL 3) 0.8 % vaginal cream Place 1 applicator vaginally at bedtime. Apply nightly for three nights. (Patient not taking: Reported on 08/10/2022) 20 g 0     Review of Systems  Constitutional:  Negative for chills and fever.  Gastrointestinal:  Negative for abdominal pain, nausea and vomiting.  Genitourinary:  Negative for difficulty urinating, dysuria, vaginal bleeding and vaginal discharge.   Physical Exam   Blood pressure (!) 114/57, pulse 84, temperature 98.7 F (37.1 C), temperature source Oral, resp. rate 17, height 5\' 3"  (1.6 m), weight 53.1 kg, last menstrual period 06/25/2022, SpO2 99 %.  Physical Exam Vitals reviewed.  Constitutional:      General: She is not in acute distress.    Appearance: Normal appearance.  HENT:     Head: Normocephalic and atraumatic.  Eyes:     Conjunctiva/sclera: Conjunctivae normal.  Cardiovascular:     Rate and Rhythm: Normal rate.  Pulmonary:     Effort: Pulmonary effort is normal. No respiratory distress.  Abdominal:     General: Bowel sounds are normal.  Musculoskeletal:        General: Normal range of motion.     Cervical back: Normal range of motion.  Skin:    General: Skin is warm and dry.  Neurological:     Mental Status: She is alert and oriented to person, place, and time.  Psychiatric:        Mood and Affect: Mood normal.        Behavior: Behavior normal.     MAU Course  Procedures Results for orders placed or performed during the hospital encounter of 08/19/22 (from the past 24 hour(s))  Comprehensive metabolic panel     Status: Abnormal   Collection Time: 08/19/22 10:29 PM  Result Value Ref Range   Sodium 139 135 - 145 mmol/L   Potassium 3.7 3.5 - 5.1 mmol/L   Chloride 105 98 - 111 mmol/L   CO2 25 22 - 32 mmol/L   Glucose, Bld 80 70 - 99 mg/dL   BUN 10 6  - 20 mg/dL   Creatinine, Ser 0.68 0.44 - 1.00 mg/dL   Calcium 9.0 8.9 - 10.3 mg/dL   Total Protein 6.3 (L) 6.5 - 8.1 g/dL   Albumin 3.5 3.5 - 5.0 g/dL   AST 22 15 - 41 U/L   ALT 18 0 - 44 U/L   Alkaline Phosphatase 34 (L) 38 - 126 U/L   Total Bilirubin 1.3 (H) 0.3 - 1.2 mg/dL   GFR, Estimated >60 >60 mL/min   Anion gap 9 5 - 15  CBC     Status: Abnormal   Collection Time: 08/19/22 10:29 PM  Result Value Ref Range   WBC 3.6 (L) 4.0 - 10.5 K/uL   RBC 3.57 (L) 3.87 - 5.11 MIL/uL   Hemoglobin 10.8 (L) 12.0 - 15.0 g/dL   HCT 33.5 (L) 36.0 - 46.0 %   MCV 93.8 80.0 - 100.0 fL   MCH 30.3 26.0 - 34.0 pg   MCHC 32.2 30.0 - 36.0 g/dL   RDW 13.2 11.5 - 15.5 %   Platelets 242 150 - 400 K/uL   nRBC 0.0 0.0 - 0.2 %  hCG, quantitative, pregnancy     Status: Abnormal   Collection Time: 08/19/22 10:29 PM  Result Value Ref Range   hCG, Beta Chain, Quant, S 1,063 (H) <5 mIU/mL    MDM CBC, CMP, hCG MTX Coordination of Follow Up Assessment and Plan  22 year old  S/P MTX Injection  -POC Reviewed. -Informed that labs will be collected and will assess need for additional MTX dosing. -Patient questions if [place] will be performed and informed not currently indicated. -Further informed that previous US did not show pregnancy, but abnormal rise in quants was diagnostic of ectopic pregnancy. -Patient verbalizes understanding. No concerns. -Await results.   Maryann Conners 08/19/2022, 10:13 PM   Reassessment (12:05 AM) -Results as above. -Provider to proceed with MTX but will consult.  -Dr. Sheryn Bison consulted and informed of patient status, evaluation, interventions, and results. Advised: *Agree with giving additional dose of MTX.    Reassessment (1:27 AM) -Patient s/p MTX dose. -Scheduled for Tuesday March 19th at 0800.  -Patient requests later appt d/t school obligations. Scheduled for 1pm. -Nurse to give precautions.  -Return to  MAU as appropriate. -Discharged to home in stable  condition.   Maryann Conners MSN, CNM Advanced Practice Provider, [facility] for [facility]

## 2022-08-19 NOTE — MAU Note (Signed)
..  Patricia Sullivan is a 22 y.o. at [redacted]w[redacted]d here in MAU reporting: was told to come back to MAU for a repeat MTX shot, but was unable to come d/t social issues. Had imaging done yesterday and it still showed something in her fallopian tube, so is back today for the shot. Denies pain. Reports vaginal bleeding that lasted 3-4 days but has resolved.   Pain score: 0/10 Vitals:   08/19/22 2202  BP: (!) 114/57  Pulse: 84  Resp: 17  Temp: 98.7 F (37.1 C)  SpO2: 99%

## 2022-08-20 DIAGNOSIS — O0091 Unspecified ectopic pregnancy with intrauterine pregnancy: Secondary | ICD-10-CM

## 2022-08-20 DIAGNOSIS — Z3A08 8 weeks gestation of pregnancy: Secondary | ICD-10-CM

## 2022-08-20 NOTE — Discharge Instructions (Signed)
  Day 0/1 Day 4 Day 7  Sunday Wednesday Saturday  Monday Thursday Sunday  Tuesday Friday Monday  Wednesday Saturday Tuesday  Thursday Sunday Wednesday  Friday Monday Thursday  Saturday Tuesday Friday

## 2022-08-23 ENCOUNTER — Other Ambulatory Visit: Payer: Medicaid Other

## 2022-08-23 ENCOUNTER — Telehealth: Payer: Self-pay | Admitting: Obstetrics & Gynecology

## 2022-08-23 ENCOUNTER — Other Ambulatory Visit (HOSPITAL_COMMUNITY)
Admission: RE | Admit: 2022-08-23 | Discharge: 2022-08-23 | Disposition: A | Discharge status: Home / Self Care | Payer: Medicaid Other | Source: Ambulatory Visit | Attending: Obstetrics and Gynecology | Admitting: Obstetrics and Gynecology

## 2022-08-23 ENCOUNTER — Other Ambulatory Visit: Payer: Self-pay

## 2022-08-23 VITALS — BP 104/69 | HR 80

## 2022-08-23 DIAGNOSIS — O009 Unspecified ectopic pregnancy without intrauterine pregnancy: Secondary | ICD-10-CM

## 2022-08-23 DIAGNOSIS — Z113 Encounter for screening for infections with a predominantly sexual mode of transmission: Secondary | ICD-10-CM | POA: Diagnosis not present

## 2022-08-23 NOTE — Progress Notes (Signed)
Taysha Wysong presents for follow up bHCG. S/P methotrexate 08/07/22 and 08/20/22 for suspected ectopic pregnancy. Ms. Courtemanche denies abdominal pain or bleeding and is in no apparent distress. Ms. Beine request STI swab and blood testing at this time.  SUBJECTIVE:  22 y.o. female who desires a STI screen. Denies abnormal vaginal discharge, bleeding or significant pelvic pain. No UTI symptoms. Denies history of known exposure to STD.  Patient's last menstrual period was 08/05/2022.  OBJECTIVE:  She appears well.   ASSESSMENT:  STI Screen   PLAN:  Pt offered STI blood screening-requested GC, chlamydia, and trichomonas probe sent to lab.  Treatment: To be determined once lab results are received.  Pt follow up as needed.

## 2022-08-23 NOTE — Telephone Encounter (Signed)
Called pt to review lab report.  HCG trending down.  Continue to follow up as scheduled.  Likely after Friday repeat lab work weekly.  Questions/concerns were addressed.  Janyth Pupa, DO Attending [place], [facility] for [facility], [place]

## 2022-08-24 LAB — HIV ANTIBODY (ROUTINE TESTING W REFLEX): HIV Screen 4th Generation wRfx: NONREACTIVE

## 2022-08-24 LAB — RPR: RPR Ser Ql: NONREACTIVE

## 2022-08-24 LAB — HEPATITIS B SURFACE ANTIGEN: Hepatitis B Surface Ag: NEGATIVE

## 2022-08-24 LAB — BETA HCG QUANT (REF LAB): hCG Quant: 591 m[IU]/mL

## 2022-08-24 LAB — HEPATITIS C ANTIBODY: Hep C Virus Ab: NONREACTIVE

## 2022-08-25 LAB — CERVICOVAGINAL ANCILLARY ONLY
Bacterial Vaginitis (gardnerella): NEGATIVE
Candida Glabrata: NEGATIVE
Candida Vaginitis: POSITIVE — AB
Chlamydia: POSITIVE — AB
Comment: NEGATIVE
Comment: NEGATIVE
Comment: NEGATIVE
Comment: NEGATIVE
Comment: NEGATIVE
Comment: NORMAL
Neisseria Gonorrhea: NEGATIVE
Trichomonas: NEGATIVE

## 2022-08-26 MED ORDER — DOXYCYCLINE HYCLATE 100 MG PO CAPS: 100.0000 mg | ORAL_CAPSULE | Freq: Two times a day (BID) | ORAL | 0 refills | Status: DC

## 2022-08-26 MED ORDER — FLUCONAZOLE 150 MG PO TABS: 150.0000 mg | ORAL_TABLET | Freq: Once | ORAL | 0 refills | Status: AC

## 2022-08-26 NOTE — Addendum Note (Signed)
Addended by: Mora Bellman on: 08/26/2022 10:24 AM   Modules accepted: Orders

## 2022-08-31 ENCOUNTER — Other Ambulatory Visit: Payer: Medicaid Other

## 2022-09-26 ENCOUNTER — Encounter: Payer: Self-pay | Admitting: Advanced Practice Midwife

## 2022-10-18 ENCOUNTER — Ambulatory Visit
Admission: RE | Admit: 2022-10-18 | Discharge: 2022-10-18 | Disposition: A | Discharge status: Home / Self Care | Payer: Medicaid Other | Source: Ambulatory Visit | Attending: Internal Medicine | Admitting: Internal Medicine

## 2022-10-18 VITALS — BP 100/66 | HR 98 | Temp 98.3°F | Resp 16

## 2022-10-18 DIAGNOSIS — Z113 Encounter for screening for infections with a predominantly sexual mode of transmission: Secondary | ICD-10-CM | POA: Diagnosis present

## 2022-10-18 NOTE — ED Triage Notes (Signed)
Patient states she was recently treated for chlamydia and is unsure if she took the medicine correctly. Patient states she just wants to make sure the chlamydia is gone.

## 2022-10-18 NOTE — ED Provider Notes (Signed)
[name]    CSN: [id number] Arrival date & time: 10/18/22  1304      History   Chief Complaint Chief Complaint  Patient presents with   SEXUALLY TRANSMITTED DISEASE    HPI Patricia Sullivan is a 22 y.o. female.   Patient presents today for STD testing.  She reports that she tested positive for chlamydia approximately 2 months ago.  She states that she was treated with a medication and took it completely but did not take it consistently.  She wants be sure that she is free from infection today.  She denies any associated symptoms today.  Reports that she has had unprotected intercourse recently but denies any new exposure to STD.  Patient also recently was diagnosed with ectopic pregnancy and has followed up with MAU.  She does not have scheduled appointment with OB/GYN currently but has been to MAU several times.  Patient not reporting abdominal pain.     Past Medical History:  Diagnosis Date   Horseshoe kidney     Patient Active Problem List   Diagnosis Date Noted   Chlamydia trachomatis infection in pregnancy 08/01/2022   Adjustment disorder with mixed anxiety and depressed mood 05/05/2020    History reviewed. No pertinent surgical history.  OB History     Gravida  1   Para      Term      Preterm      AB      Living  0      SAB      IAB      Ectopic      Multiple      Live Births               Home Medications    Prior to Admission medications   Not on File    Family History Family History  Problem Relation Age of Onset   Healthy Mother    Healthy Father     Social History Social History   Tobacco Use   Smoking status: Never   Smokeless tobacco: Never  Substance Use Topics   Alcohol use: Yes    Comment: Occasionally    Drug use: Yes    Types: Marijuana    Comment: Occasionally     Allergies   Patient has no known allergies.   Review of Systems Review of Systems Per HPI  Physical Exam Triage Vital  Signs ED Triage Vitals  Enc Vitals Group     BP 10/18/22 1331 (!) 98/57     Pulse Rate 10/18/22 1331 98     Resp 10/18/22 1331 16     Temp 10/18/22 1331 98.3 F (36.8 C)     Temp Source 10/18/22 1331 Oral     SpO2 10/18/22 1331 98 %     Weight --      Height --      Head Circumference --      Peak Flow --      Pain Score 10/18/22 1332 0     Pain Loc --      Pain Edu? --      Excl. in GC? --    No data found.  Updated Vital Signs BP 100/66   Pulse 98   Temp 98.3 F (36.8 C) (Oral)   Resp 16   LMP  (LMP Unknown)   SpO2 98%   Visual Acuity Right Eye Distance:   Left Eye Distance:   Bilateral Distance:    Right  Eye Near:   Left Eye Near:    Bilateral Near:     Physical Exam Constitutional:      General: She is not in acute distress.    Appearance: Normal appearance. She is not toxic-appearing or diaphoretic.  HENT:     Head: Normocephalic and atraumatic.  Eyes:     Extraocular Movements: Extraocular movements intact.     Conjunctiva/sclera: Conjunctivae normal.  Pulmonary:     Effort: Pulmonary effort is normal.  Genitourinary:    Comments: Deferred with shared decision making.  Self swab performed. Neurological:     General: No focal deficit present.     Mental Status: She is alert and oriented to person, place, and time. Mental status is at baseline.  Psychiatric:        Mood and Affect: Mood normal.        Behavior: Behavior normal.        Thought Content: Thought content normal.        Judgment: Judgment normal.      UC Treatments / Results  Labs (all labs ordered are listed, but only abnormal results are displayed) Labs Reviewed  CERVICOVAGINAL ANCILLARY ONLY    EKG   Radiology No results found.  Procedures Procedures (including critical care time)  Medications Ordered in UC Medications - No data to display  Initial Impression / Assessment and Plan / UC Course  I have reviewed the triage vital signs and the nursing  notes.  Pertinent labs & imaging results that were available during my care of the patient were reviewed by me and considered in my medical decision making (see chart for details).     Patient here for STD testing to ensure that chlamydia infection is clear.  Cervicovaginal swab pending.  Encouraged to follow-up for ectopic pregnancy follow-up with OB/GYN.  She was provided with contact information.  Not currently experiencing any symptoms related to STD or ectopic pregnancy so do not think that emergent evaluation is necessary.  Advised strict follow-up.  Patient verbalized understanding and was agreeable with plan. Final Clinical Impressions(s) / UC Diagnoses   Final diagnoses:  Screening examination for venereal disease     Discharge Instructions      Vaginal swab is pending.  Will call if it is abnormal and treat if appropriate.  Follow-up if any symptoms persist or worsen.    ED Prescriptions   None    PDMP not reviewed this encounter.   Gustavus Bryant, [place] 10/18/22 1415

## 2022-10-18 NOTE — Discharge Instructions (Signed)
Vaginal swab is pending.  Will call if it is abnormal and treat if appropriate.  Follow-up if any symptoms persist or worsen.

## 2022-10-19 LAB — CERVICOVAGINAL ANCILLARY ONLY
Bacterial Vaginitis (gardnerella): NEGATIVE
Candida Glabrata: NEGATIVE
Candida Vaginitis: NEGATIVE
Chlamydia: NEGATIVE
Comment: NEGATIVE
Comment: NEGATIVE
Comment: NEGATIVE
Comment: NEGATIVE
Comment: NEGATIVE
Comment: NORMAL
Neisseria Gonorrhea: NEGATIVE
Trichomonas: NEGATIVE

## 2022-11-08 ENCOUNTER — Encounter (HOSPITAL_BASED_OUTPATIENT_CLINIC_OR_DEPARTMENT_OTHER): Payer: Self-pay

## 2022-11-08 ENCOUNTER — Other Ambulatory Visit: Payer: Self-pay

## 2022-11-08 ENCOUNTER — Emergency Department (HOSPITAL_BASED_OUTPATIENT_CLINIC_OR_DEPARTMENT_OTHER): Payer: Medicaid Other

## 2022-11-08 ENCOUNTER — Emergency Department (HOSPITAL_BASED_OUTPATIENT_CLINIC_OR_DEPARTMENT_OTHER)
Admission: EM | Admit: 2022-11-08 | Discharge: 2022-11-08 | Disposition: A | Discharge status: Home / Self Care | Payer: Medicaid Other | Attending: Emergency Medicine | Admitting: Emergency Medicine

## 2022-11-08 DIAGNOSIS — R748 Abnormal levels of other serum enzymes: Secondary | ICD-10-CM | POA: Diagnosis not present

## 2022-11-08 DIAGNOSIS — Z202 Contact with and (suspected) exposure to infections with a predominantly sexual mode of transmission: Secondary | ICD-10-CM | POA: Diagnosis not present

## 2022-11-08 DIAGNOSIS — R112 Nausea with vomiting, unspecified: Secondary | ICD-10-CM | POA: Insufficient documentation

## 2022-11-08 LAB — URINALYSIS, ROUTINE W REFLEX MICROSCOPIC
Bilirubin Urine: NEGATIVE
Glucose, UA: NEGATIVE mg/dL
Ketones, ur: 15 mg/dL — AB
Nitrite: NEGATIVE
Specific Gravity, Urine: 1.026 (ref 1.005–1.030)
pH: 6.5 (ref 5.0–8.0)

## 2022-11-08 LAB — CBC
HCT: 39.1 % (ref 36.0–46.0)
Hemoglobin: 13.2 g/dL (ref 12.0–15.0)
MCH: 29.9 pg (ref 26.0–34.0)
MCHC: 33.8 g/dL (ref 30.0–36.0)
MCV: 88.5 fL (ref 80.0–100.0)
Platelets: 291 10*3/uL (ref 150–400)
RBC: 4.42 MIL/uL (ref 3.87–5.11)
RDW: 12.5 % (ref 11.5–15.5)
WBC: 3.7 10*3/uL — ABNORMAL LOW (ref 4.0–10.5)
nRBC: 0 % (ref 0.0–0.2)

## 2022-11-08 LAB — COMPREHENSIVE METABOLIC PANEL
ALT: 9 U/L (ref 0–44)
AST: 19 U/L (ref 15–41)
Albumin: 4.5 g/dL (ref 3.5–5.0)
Alkaline Phosphatase: 37 U/L — ABNORMAL LOW (ref 38–126)
Anion gap: 8 (ref 5–15)
BUN: 13 mg/dL (ref 6–20)
CO2: 28 mmol/L (ref 22–32)
Calcium: 9.6 mg/dL (ref 8.9–10.3)
Chloride: 99 mmol/L (ref 98–111)
Creatinine, Ser: 0.72 mg/dL (ref 0.44–1.00)
GFR, Estimated: >60 mL/min (ref >60)
Glucose, Bld: 92 mg/dL (ref 70–99)
Potassium: 3.3 mmol/L — ABNORMAL LOW (ref 3.5–5.1)
Sodium: 135 mmol/L (ref 135–145)
Total Bilirubin: 1.1 mg/dL (ref 0.3–1.2)
Total Protein: 7.7 g/dL (ref 6.5–8.1)

## 2022-11-08 LAB — WET PREP, GENITAL
Clue Cells Wet Prep HPF POC: NONE SEEN
Sperm: NONE SEEN
Trich, Wet Prep: NONE SEEN
WBC, Wet Prep HPF POC: >=10 — AB (ref <10)
Yeast Wet Prep HPF POC: NONE SEEN

## 2022-11-08 LAB — LIPASE, BLOOD: Lipase: 107 U/L — ABNORMAL HIGH (ref 11–51)

## 2022-11-08 LAB — PREGNANCY, URINE: Preg Test, Ur: NEGATIVE

## 2022-11-08 MED ORDER — ONDANSETRON HCL 4 MG PO TABS: 4.0000 mg | ORAL_TABLET | Freq: Three times a day (TID) | ORAL | 0 refills | Status: DC | PRN

## 2022-11-08 MED ORDER — ONDANSETRON HCL 4 MG/2ML IJ SOLN
4.0000 mg | Freq: Once | INTRAMUSCULAR | Status: AC
  Administered 2022-11-08: 4 mg via INTRAVENOUS
  Filled 2022-11-08: qty 2

## 2022-11-08 MED ORDER — DOXYCYCLINE HYCLATE 100 MG PO TABS: 100.0000 mg | ORAL_TABLET | Freq: Two times a day (BID) | ORAL | 0 refills | Status: AC

## 2022-11-08 MED ORDER — IOHEXOL 300 MG/ML  SOLN
100.0000 mL | Freq: Once | INTRAMUSCULAR | Status: AC | PRN
  Administered 2022-11-08: 70 mL via INTRAVENOUS

## 2022-11-08 MED ORDER — SODIUM CHLORIDE 0.9 % IV BOLUS
1000.0000 mL | Freq: Once | INTRAVENOUS | Status: AC
  Administered 2022-11-08: 1000 mL via INTRAVENOUS

## 2022-11-08 MED ORDER — CEFTRIAXONE SODIUM 500 MG IJ SOLR
500.0000 mg | Freq: Once | INTRAMUSCULAR | Status: AC
  Administered 2022-11-08: 500 mg via INTRAMUSCULAR
  Filled 2022-11-08: qty 500

## 2022-11-08 MED ORDER — LIDOCAINE HCL (PF) 1 % IJ SOLN
1.0000 mL | Freq: Once | INTRAMUSCULAR | Status: DC
  Filled 2022-11-08: qty 5

## 2022-11-08 NOTE — Discharge Instructions (Addendum)
It was a pleasure taking care of you today!  Your lab and imaging studies did not show any concerning emergent findings at this time.  Your lipase was elevated today, it is recommended that you decrease you alcohol intake. You have pending lab results for STI work-up.  You may see the results of your labs on MyChart.  You were treated today for gonorrhea and chlamydia due to your exposure. You will be sent a prescription for Doxycycline. Ensure to complete the entire course of antibiotics prescribed. You may follow up with your primary care provider or Health Department if you are experiencing continued symptoms.   You will be sent a prescription for Zofran, take as directed if you are experiencing nausea/vomiting.  If you have to take Zofran, wait at least 30 minutes before you attempt small sips/small bites of food/fluid.  Ensure to maintain fluid intake with water, tea, broth, soup, Pedialyte, Gatorade. Return to the Emergency Department if you are experiencing increasing/worsening symptoms.

## 2022-11-08 NOTE — ED Notes (Signed)
Out to CT 

## 2022-11-08 NOTE — ED Notes (Signed)
Pt verbalized understanding of d/c instructions, meds, and followup care. Denies questions. VSS, no distress noted. Steady gait to exit with all belongings.  ?

## 2022-11-08 NOTE — ED Triage Notes (Signed)
Patient here POV from Home.  Endorses N/V/D that began Friday. Recent Trip to [place]. No Fevers. No Dysuria.   NAD Noted during Triage. A&Ox4. GCS 15. Ambulatory.
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
## 2022-11-09 LAB — GC/CHLAMYDIA PROBE AMP (~~LOC~~) NOT AT ARMC
Chlamydia: NEGATIVE
Comment: NEGATIVE
Comment: NORMAL
Neisseria Gonorrhea: NEGATIVE

## 2022-11-09 LAB — RPR: RPR Ser Ql: NONREACTIVE

## 2022-11-28 LAB — MISC LABCORP TEST (SEND OUT): Labcorp test code: 83935

## 2023-01-11 ENCOUNTER — Ambulatory Visit
Admission: RE | Admit: 2023-01-11 | Discharge: 2023-01-11 | Disposition: A | Discharge status: Home / Self Care | Payer: Medicaid Other | Source: Ambulatory Visit | Attending: Internal Medicine | Admitting: Internal Medicine

## 2023-01-11 VITALS — BP 122/80 | HR 73 | Temp 98.9°F | Resp 20

## 2023-01-11 DIAGNOSIS — Z9189 Other specified personal risk factors, not elsewhere classified: Secondary | ICD-10-CM | POA: Insufficient documentation

## 2023-01-11 DIAGNOSIS — J029 Acute pharyngitis, unspecified: Secondary | ICD-10-CM | POA: Diagnosis present

## 2023-01-11 NOTE — ED Provider Notes (Signed)
[facility]    CSN: [id number] Arrival date & time: 01/11/23  1248      History   Chief Complaint Chief Complaint  Patient presents with   SEXUALLY TRANSMITTED DISEASE    HPI Patricia Sullivan is a 22 y.o. female.   Patient presents to urgent care for evaluation of sore throat that started last week.  Sore throat has improved significantly since onset over 7 days ago and she denies difficulty maintaining secretions, pain with swallowing, throat closure sensation, runny nose, cough, and fever/chills.  Her mom tested positive for COVID-19 last week and she believes she may have been exposed.  Patient would also like STD testing.  She is not experiencing any vaginal symptoms, urinary symptoms, or N/V/D.  She is sexually active without protection with 3 different female partners. No known exposures to STD.      Past Medical History:  Diagnosis Date   Horseshoe kidney     Patient Active Problem List   Diagnosis Date Noted   Chlamydia trachomatis infection in pregnancy 08/01/2022   Adjustment disorder with mixed anxiety and depressed mood 05/05/2020    History reviewed. No pertinent surgical history.  OB History     Gravida  1   Para      Term      Preterm      AB      Living  0      SAB      IAB      Ectopic      Multiple      Live Births               Home Medications    Prior to Admission medications   Medication Sig Start Date End Date Taking? Authorizing Provider  ondansetron (ZOFRAN) 4 MG tablet Take 1 tablet (4 mg total) by mouth every 8 (eight) hours as needed for nausea or vomiting. 11/08/22   Blue, Soijett A, PA-C    Family History Family History  Problem Relation Age of Onset   Healthy Mother    Healthy Father     Social History Social History   Tobacco Use   Smoking status: Never   Smokeless tobacco: Never  Vaping Use   Vaping status: Never Used  Substance Use Topics   Alcohol use: Yes    Comment: weekly   Drug use:  Yes    Types: Marijuana    Comment: Occasionally     Allergies   Patient has no known allergies.   Review of Systems Review of Systems Per HPI  Physical Exam Triage Vital Signs ED Triage Vitals  Encounter Vitals Group     BP 01/11/23 1319 122/80     Systolic BP Percentile --      Diastolic BP Percentile --      Pulse Rate 01/11/23 1319 73     Resp 01/11/23 1319 20     Temp 01/11/23 1319 98.9 F (37.2 C)     Temp Source 01/11/23 1319 Oral     SpO2 01/11/23 1319 97 %     Weight --      Height --      Head Circumference --      Peak Flow --      Pain Score 01/11/23 1318 0     Pain Loc --      Pain Education --      Exclude from Growth Chart --    No data found.  Updated Vital Signs  BP 122/80 (BP Location: Right Arm)   Pulse 73   Temp 98.9 F (37.2 C) (Oral)   Resp 20   LMP 12/27/2022 (Approximate)   SpO2 97%   Visual Acuity Right Eye Distance:   Left Eye Distance:   Bilateral Distance:    Right Eye Near:   Left Eye Near:    Bilateral Near:     Physical Exam Vitals and nursing note reviewed.  Constitutional:      Appearance: She is not ill-appearing or toxic-appearing.  HENT:     Head: Normocephalic and atraumatic.     Right Ear: Hearing and external ear normal.     Left Ear: Hearing and external ear normal.     Nose: Nose normal.     Mouth/Throat:     Lips: Pink.     Mouth: Mucous membranes are moist. No injury.     Tongue: No lesions. Tongue does not deviate from midline.     Palate: No mass and lesions.     Pharynx: Oropharynx is clear. Uvula midline. No pharyngeal swelling, oropharyngeal exudate, posterior oropharyngeal erythema or uvula swelling.     Tonsils: No tonsillar exudate or tonsillar abscesses.  Eyes:     General: Lids are normal. Vision grossly intact. Gaze aligned appropriately.     Extraocular Movements: Extraocular movements intact.     Conjunctiva/sclera: Conjunctivae normal.  Pulmonary:     Effort: Pulmonary effort is  normal.  Musculoskeletal:     Cervical back: Neck supple.  Skin:    General: Skin is warm and dry.     Capillary Refill: Capillary refill takes less than 2 seconds.     Findings: No rash.  Neurological:     General: No focal deficit present.     Mental Status: She is alert and oriented to person, place, and time. Mental status is at baseline.     Cranial Nerves: No dysarthria or facial asymmetry.  Psychiatric:        Mood and Affect: Mood normal.        Speech: Speech normal.        Behavior: Behavior normal.        Thought Content: Thought content normal.        Judgment: Judgment normal.      UC Treatments / Results  Labs (all labs ordered are listed, but only abnormal results are displayed) Labs Reviewed  HIV ANTIBODY (ROUTINE TESTING W REFLEX)  RPR  CERVICOVAGINAL ANCILLARY ONLY    EKG   Radiology No results found.  Procedures Procedures (including critical care time)  Medications Ordered in UC Medications - No data to display  Initial Impression / Assessment and Plan / UC Course  I have reviewed the triage vital signs and the nursing notes.  Pertinent labs & imaging results that were available during my care of the patient were reviewed by me and considered in my medical decision making (see chart for details).   1. At risk for sexually transmitted disease due to unprotected sex STI labs pending, will notify patient of positive results and treat accordingly per protocol when labs result.  Patient would like HIV and syphilis testing today.   Patient to avoid sexual intercourse until screening testing comes back.   Education provided regarding safe sexual practices and patient encouraged to use protection to prevent spread of STIs.   Low suspicion for bacterial pharyngitis/STD to the posterior oropharynx, therefore deferred testing. Sore throat likely viral.  May use tylenol/ibuprofen as needed. Will defer  COVID testing due to timing of illness.    Counseled patient on potential for adverse effects with medications prescribed/recommended today, strict ER and return-to-clinic precautions discussed, patient verbalized understanding.    Final Clinical Impressions(s) / UC Diagnoses   Final diagnoses:  At risk for sexually transmitted infection due to unprotected sex  Sore throat     Discharge Instructions      STD testing pending, this will take 2-3 days to result. We will only call you if your testing is positive for any infection(s) and we will provide treatment.  Avoid sexual intercourse until your STD results come back.  If any of your STD results are positive, you will need to avoid sexual intercourse for 7 days while you are being treated to prevent spread of STD.  Condom use is the best way to prevent spread of STDs.  Return to urgent care as needed.      ED Prescriptions   None    PDMP not reviewed this encounter.   Carlisle Beers, [place] 01/11/23 1345

## 2023-01-11 NOTE — ED Triage Notes (Signed)
Pt c/o bumps to the back of my throat" x 1 week-requesting STD testing also requesting covid test due to exposure-NAD-steady gait

## 2023-01-11 NOTE — Discharge Instructions (Signed)
STD testing pending, this will take 2-3 days to result. We will only call you if your testing is positive for any infection(s) and we will provide treatment.  Avoid sexual intercourse until your STD results come back.  If any of your STD results are positive, you will need to avoid sexual intercourse for 7 days while you are being treated to prevent spread of STD.  Condom use is the best way to prevent spread of STDs.  Return to urgent care as needed.

## 2023-03-20 ENCOUNTER — Ambulatory Visit
Admission: RE | Admit: 2023-03-20 | Discharge: 2023-03-20 | Disposition: A | Discharge status: Home / Self Care | Payer: Medicaid Other | Source: Ambulatory Visit | Attending: Emergency Medicine | Admitting: Emergency Medicine

## 2023-03-20 VITALS — BP 111/67 | HR 109 | Resp 18

## 2023-03-20 DIAGNOSIS — Z113 Encounter for screening for infections with a predominantly sexual mode of transmission: Secondary | ICD-10-CM | POA: Insufficient documentation

## 2023-03-20 NOTE — ED Triage Notes (Signed)
Pt presents for std testing. Denies sxs.

## 2023-03-20 NOTE — Discharge Instructions (Signed)
We will call you if anything on your swab returns positive. You can also see these results on MyChart. Please abstain from sexual intercourse until your results return.

## 2023-03-20 NOTE — ED Provider Notes (Signed)
[facility]    CSN: [id number] Arrival date & time: 03/20/23  1359     History   Chief Complaint Chief Complaint  Patient presents with   SEXUALLY TRANSMITTED DISEASE    Check up - Entered by patient    HPI Patricia Sullivan is a 22 y.o. female.  Requesting STD testing No known exposures although new partner, unprotected intercourse Not having any symptoms at this time  Had negative HIV/RPR 2 months ago  Past Medical History:  Diagnosis Date   Horseshoe kidney     Patient Active Problem List   Diagnosis Date Noted   Chlamydia trachomatis infection in pregnancy 08/01/2022   Adjustment disorder with mixed anxiety and depressed mood 05/05/2020    History reviewed. No pertinent surgical history.  OB History     Gravida  1   Para      Term      Preterm      AB      Living  0      SAB      IAB      Ectopic      Multiple      Live Births               Home Medications    Prior to Admission medications   Medication Sig Start Date End Date Taking? Authorizing Provider  ondansetron (ZOFRAN) 4 MG tablet Take 1 tablet (4 mg total) by mouth every 8 (eight) hours as needed for nausea or vomiting. 11/08/22   Blue, Soijett A, PA-C    Family History Family History  Problem Relation Age of Onset   Healthy Mother    Healthy Father     Social History Social History   Tobacco Use   Smoking status: Never   Smokeless tobacco: Never  Vaping Use   Vaping status: Never Used  Substance Use Topics   Alcohol use: Yes    Comment: weekly   Drug use: Yes    Types: Marijuana    Comment: Occasionally     Allergies   Patient has no known allergies.   Review of Systems Review of Systems   Physical Exam Triage Vital Signs ED Triage Vitals  Encounter Vitals Group     BP 03/20/23 1413 111/67     Systolic BP Percentile --      Diastolic BP Percentile --      Pulse Rate 03/20/23 1413 (!) 109     Resp 03/20/23 1412 18     Temp --       Temp src --      SpO2 03/20/23 1429 97 %     Weight --      Height --      Head Circumference --      Peak Flow --      Pain Score --      Pain Loc --      Pain Education --      Exclude from Growth Chart --    No data found.  Updated Vital Signs BP 111/67   Pulse (!) 109   Resp 18   LMP 03/03/2023 (Exact Date)   SpO2 97%   HR 98   Physical Exam Vitals and nursing note reviewed.  Constitutional:      General: She is not in acute distress.    Appearance: Normal appearance.  Cardiovascular:     Rate and Rhythm: Normal rate and regular rhythm.  Pulmonary:  Effort: Pulmonary effort is normal.  Neurological:     Mental Status: She is alert and oriented to person, place, and time.     UC Treatments / Results  Labs (all labs ordered are listed, but only abnormal results are displayed) Labs Reviewed  CERVICOVAGINAL ANCILLARY ONLY    EKG  Radiology No results found.  Procedures Procedures (including critical care time)  Medications Ordered in UC Medications - No data to display  Initial Impression / Assessment and Plan / UC Course  I have reviewed the triage vital signs and the nursing notes.  Pertinent labs & imaging results that were available during my care of the patient were reviewed by me and considered in my medical decision making (see chart for details).  Cytology swab pending. Do not recommend repeat blood work today Treat positive result as indicated Safe sex practices No questions at this time  Final Clinical Impressions(s) / UC Diagnoses   Final diagnoses:  Screen for STD (sexually transmitted disease)     Discharge Instructions      We will call you if anything on your swab returns positive. You can also see these results on MyChart. Please abstain from sexual intercourse until your results return.     ED Prescriptions   None    PDMP not reviewed this encounter.   Marlow Baars, [place] 03/20/23 1436

## 2023-03-21 LAB — CERVICOVAGINAL ANCILLARY ONLY
Chlamydia: NEGATIVE
Comment: NEGATIVE
Comment: NEGATIVE
Comment: NORMAL
Neisseria Gonorrhea: NEGATIVE
Trichomonas: NEGATIVE

## 2023-12-13 ENCOUNTER — Ambulatory Visit
Admission: EM | Admit: 2023-12-13 | Discharge: 2023-12-13 | Disposition: A | Discharge status: Home / Self Care | Attending: Family Medicine | Admitting: Family Medicine

## 2023-12-13 DIAGNOSIS — Z113 Encounter for screening for infections with a predominantly sexual mode of transmission: Secondary | ICD-10-CM | POA: Insufficient documentation

## 2023-12-13 NOTE — ED Triage Notes (Signed)
Pt requesting STD testing-denies sx-NAD-steady gait

## 2023-12-13 NOTE — ED Provider Notes (Signed)
  [facility]  Note:  This document was prepared using Conservation officer, historic buildings and may include unintentional dictation errors.  MRN: [id number] DOB: Aug 13, 2000  Subjective:   Patricia Sullivan is a 23 y.o. female presenting for STI screening.  Patient has a new sex partner and wants to have complete testing.  Denies fever, n/v, abdominal pain, pelvic pain, rashes, dysuria, urinary frequency, hematuria, vaginal discharge.  No known exposures.  No current facility-administered medications for this encounter.  Current Outpatient Medications:    ondansetron  (ZOFRAN ) 4 MG tablet, Take 1 tablet (4 mg total) by mouth every 8 (eight) hours as needed for nausea or vomiting., Disp: 12 tablet, Rfl: 0   No Known Allergies  Past Medical History:  Diagnosis Date   Horseshoe kidney      History reviewed. No pertinent surgical history.  Family History  Problem Relation Age of Onset   Healthy Mother    Healthy Father     Social History   Tobacco Use   Smoking status: Never   Smokeless tobacco: Never  Vaping Use   Vaping status: Never Used  Substance Use Topics   Alcohol use: Yes    Comment: occ   Drug use: Yes    Types: Marijuana    ROS   Objective:   Vitals: BP 117/65 (BP Location: Left Arm)   Pulse 80   Temp 98.8 F (37.1 C) (Oral)   Resp 16   LMP 12/11/2023   SpO2 98%   Physical Exam Constitutional:      General: She is not in acute distress.    Appearance: Normal appearance. She is well-developed. She is not ill-appearing, toxic-appearing or diaphoretic.  HENT:     Head: Normocephalic and atraumatic.     Nose: Nose normal.     Mouth/Throat:     Mouth: Mucous membranes are moist.  Eyes:     General: No scleral icterus.       Right eye: No discharge.        Left eye: No discharge.     Extraocular Movements: Extraocular movements intact.  Cardiovascular:     Rate and Rhythm: Normal rate.  Pulmonary:     Effort: Pulmonary effort  is normal.  Skin:    General: Skin is warm and dry.  Neurological:     General: No focal deficit present.     Mental Status: She is alert and oriented to person, place, and time.  Psychiatric:        Mood and Affect: Mood normal.        Behavior: Behavior normal.     Assessment and Plan :   PDMP not reviewed this encounter.  1. Screen for STD (sexually transmitted disease)    Labs pending, will treat as appropriate based off of lab results.   Christopher Savannah, [place] 12/13/23 [phone]

## 2023-12-14 LAB — CERVICOVAGINAL ANCILLARY ONLY
Chlamydia: NEGATIVE
Comment: NEGATIVE
Comment: NEGATIVE
Comment: NORMAL
Neisseria Gonorrhea: NEGATIVE
Trichomonas: NEGATIVE

## 2023-12-14 LAB — RPR: RPR Ser Ql: NONREACTIVE

## 2023-12-14 LAB — HIV ANTIBODY (ROUTINE TESTING W REFLEX): HIV Screen 4th Generation wRfx: NONREACTIVE

## 2023-12-24 ENCOUNTER — Encounter (HOSPITAL_COMMUNITY): Payer: Self-pay

## 2023-12-24 ENCOUNTER — Emergency Department (HOSPITAL_COMMUNITY)
Admission: EM | Admit: 2023-12-24 | Discharge: 2023-12-25 | Disposition: A | Discharge status: Home / Self Care | Attending: Emergency Medicine | Admitting: Emergency Medicine

## 2023-12-24 ENCOUNTER — Other Ambulatory Visit: Payer: Self-pay

## 2023-12-24 DIAGNOSIS — R112 Nausea with vomiting, unspecified: Secondary | ICD-10-CM | POA: Insufficient documentation

## 2023-12-24 DIAGNOSIS — D72829 Elevated white blood cell count, unspecified: Secondary | ICD-10-CM | POA: Diagnosis not present

## 2023-12-24 DIAGNOSIS — E876 Hypokalemia: Secondary | ICD-10-CM | POA: Diagnosis not present

## 2023-12-24 LAB — CBC
HCT: 39.7 % (ref 36.0–46.0)
Hemoglobin: 12.9 g/dL (ref 12.0–15.0)
MCH: 30.1 pg (ref 26.0–34.0)
MCHC: 32.5 g/dL (ref 30.0–36.0)
MCV: 92.8 fL (ref 80.0–100.0)
Platelets: 295 K/uL (ref 150–400)
RBC: 4.28 MIL/uL (ref 3.87–5.11)
RDW: 13 % (ref 11.5–15.5)
WBC: 9.2 K/uL (ref 4.0–10.5)
nRBC: 0 % (ref 0.0–0.2)

## 2023-12-24 LAB — URINALYSIS, ROUTINE W REFLEX MICROSCOPIC
Bilirubin Urine: NEGATIVE
Glucose, UA: NEGATIVE mg/dL
Hgb urine dipstick: NEGATIVE
Ketones, ur: 80 mg/dL — AB
Nitrite: NEGATIVE
Protein, ur: 100 mg/dL — AB
Specific Gravity, Urine: 1.028 (ref 1.005–1.030)
pH: 5 (ref 5.0–8.0)

## 2023-12-24 LAB — LIPASE, BLOOD: Lipase: 23 U/L (ref 11–51)

## 2023-12-24 LAB — COMPREHENSIVE METABOLIC PANEL WITH GFR
ALT: 18 U/L (ref 0–44)
AST: 28 U/L (ref 15–41)
Albumin: 4.5 g/dL (ref 3.5–5.0)
Alkaline Phosphatase: 45 U/L (ref 38–126)
Anion gap: 14 (ref 5–15)
BUN: 13 mg/dL (ref 6–20)
CO2: 20 mmol/L — ABNORMAL LOW (ref 22–32)
Calcium: 9.5 mg/dL (ref 8.9–10.3)
Chloride: 104 mmol/L (ref 98–111)
Creatinine, Ser: 0.66 mg/dL (ref 0.44–1.00)
GFR, Estimated: >60 mL/min (ref >60)
Glucose, Bld: 100 mg/dL — ABNORMAL HIGH (ref 70–99)
Potassium: 3.4 mmol/L — ABNORMAL LOW (ref 3.5–5.1)
Sodium: 138 mmol/L (ref 135–145)
Total Bilirubin: 1.5 mg/dL — ABNORMAL HIGH (ref 0.0–1.2)
Total Protein: 8.2 g/dL — ABNORMAL HIGH (ref 6.5–8.1)

## 2023-12-24 LAB — CBG MONITORING, ED: Glucose-Capillary: 85 mg/dL (ref 70–99)

## 2023-12-24 LAB — HCG, SERUM, QUALITATIVE: Preg, Serum: NEGATIVE

## 2023-12-24 MED ORDER — POTASSIUM CHLORIDE CRYS ER 20 MEQ PO TBCR
40.0000 meq | EXTENDED_RELEASE_TABLET | Freq: Once | ORAL | Status: AC
  Administered 2023-12-24: 40 meq via ORAL
  Filled 2023-12-24: qty 2

## 2023-12-24 MED ORDER — ONDANSETRON HCL 4 MG/2ML IJ SOLN
4.0000 mg | Freq: Once | INTRAMUSCULAR | Status: AC
  Administered 2023-12-24: 4 mg via INTRAVENOUS
  Filled 2023-12-24: qty 2

## 2023-12-24 MED ORDER — SODIUM CHLORIDE 0.9 % IV BOLUS
1000.0000 mL | Freq: Once | INTRAVENOUS | Status: AC
  Administered 2023-12-24: 1000 mL via INTRAVENOUS

## 2023-12-24 MED ORDER — ONDANSETRON 4 MG PO TBDP
4.0000 mg | ORAL_TABLET | Freq: Once | ORAL | Status: AC | PRN
  Administered 2023-12-24: 4 mg via ORAL
  Filled 2023-12-24: qty 1

## 2023-12-24 NOTE — ED Provider Notes (Signed)
 [place] EMERGENCY DEPARTMENT AT [facility] Provider Note   CSN: [id number] Arrival date & time: 12/24/23  8060     Patient presents with: Emesis   Patricia Sullivan is a 23 y.o. female with history of Devorah of kidney.  Patient presents to ED for evaluation of nausea, vomiting.  Reports that last night she was at her friend's quinceanera.  States that she had a significant amount of alcohol.  She reports that she was mixing dark and brown liquor.  The patient does not remember leaving the party apparently should be carried out.  She states that at the party she began having nausea and vomiting which persisted into this morning.  She states that she is unable to keep anything down on her stomach.  She endorses nausea, vomiting.  She denies any diarrhea.  She endorses abdominal pain but states abdominal pain only present when she is actively throwing up.  Denies fevers at home.  Denies dysuria or flank pain.  Denies chest pain or shortness of breath.  Denies drug use.   Emesis Associated symptoms: abdominal pain        Prior to Admission medications   Medication Sig Start Date End Date Taking? Authorizing Provider  ondansetron  (ZOFRAN ) 4 MG tablet Take 1 tablet (4 mg total) by mouth every 6 (six) hours. 12/25/23  Yes Ruthell Lonni FALCON, PA-C    Allergies: Patient has no known allergies.    Review of Systems  Gastrointestinal:  Positive for abdominal pain, nausea and vomiting.  All other systems reviewed and are negative.   Updated Vital Signs BP 130/83   Pulse (!) 108   Temp 98.1 F (36.7 C)   Resp 18   Ht 5' 2 (1.575 m)   Wt 52.2 kg   LMP 12/11/2023   SpO2 100%   BMI 21.03 kg/m   Physical Exam Vitals and nursing note reviewed.  Constitutional:      General: She is not in acute distress.    Appearance: She is well-developed.     Comments: Lying in bed, texting on her phone, no apparent distress  HENT:     Head: Normocephalic and atraumatic.  Eyes:      Conjunctiva/sclera: Conjunctivae normal.  Cardiovascular:     Rate and Rhythm: Normal rate and regular rhythm.     Heart sounds: No murmur heard. Pulmonary:     Effort: Pulmonary effort is normal. No respiratory distress.     Breath sounds: Normal breath sounds.  Abdominal:     Palpations: Abdomen is soft.     Tenderness: There is no abdominal tenderness.     Comments: No abdominal pain, no TTP.  No overlying skin change rebound or guarding.  Musculoskeletal:        General: No swelling.     Cervical back: Neck supple.  Skin:    General: Skin is warm and dry.     Capillary Refill: Capillary refill takes less than 2 seconds.  Neurological:     Mental Status: She is alert and oriented to person, place, and time. Mental status is at baseline.  Psychiatric:        Mood and Affect: Mood normal.     (all labs ordered are listed, but only abnormal results are displayed) Labs Reviewed  COMPREHENSIVE METABOLIC PANEL WITH GFR - Abnormal; Notable for the following components:      Result Value   Potassium 3.4 (*)    CO2 20 (*)    Glucose, Bld 100 (*)  Total Protein 8.2 (*)    Total Bilirubin 1.5 (*)    All other components within normal limits  URINALYSIS, ROUTINE W REFLEX MICROSCOPIC - Abnormal; Notable for the following components:   APPearance HAZY (*)    Ketones, ur 80 (*)    Protein, ur 100 (*)    Leukocytes,Ua TRACE (*)    Bacteria, UA RARE (*)    All other components within normal limits  LIPASE, BLOOD  CBC  HCG, SERUM, QUALITATIVE  RAPID URINE DRUG SCREEN, HOSP PERFORMED  CBG MONITORING, ED    EKG: None  Radiology: No results found.  Procedures   Medications Ordered in the ED  ondansetron  (ZOFRAN -ODT) disintegrating tablet 4 mg (4 mg Oral Given 12/24/23 2012)  sodium chloride  0.9 % bolus 1,000 mL (0 mLs Intravenous Stopped 12/24/23 2358)  ondansetron  (ZOFRAN ) injection 4 mg (4 mg Intravenous Given 12/24/23 2252)  potassium chloride  SA (KLOR-CON  M) CR tablet 40  mEq (40 mEq Oral Given 12/24/23 2255)  sodium chloride  0.9 % bolus 1,000 mL (1,000 mLs Intravenous New Bag/Given 12/25/23 0024)     Medical Decision Making Amount and/or Complexity of Data Reviewed Labs: ordered.  Risk Prescription drug management.   23 year old female presents for evaluation.  Please see HPI for further details.  23 year old female presents for persistent nausea, vomiting.  Reports that she drank an excessive amount of alcohol yesterday.  States that she does not remember leaving her friend's party.  Reports that she has not been able to keep anything down all day.  She has no abdominal tenderness on exam.  Her vital signs are overall reassuring however she is slightly tachycardic.  Labs ordered in triage include CBC, CMP, lipase, urinalysis, qualitative hCG, EKG.  Patient lab work is unremarkable.  CBC without leukocytosis or anemia.  Metabolic panel with slightly low potassium of 3.4 repleted with 40 mEq oral potassium, no other electrolyte derangement, bilirubin 1.5 however no tenderness on exam.  Lipase 23.  Urinalysis shows trace leukocytes, rare bacteria, squamous cells present.  She denies dysuria, suspect examination.  She does have ketones in her urine.  EKG nonischemic.  Patient given Zofran , 1 L fluid.  On reassessment, patient passes p.o. fluid challenge.  She is slightly still tachycardic at 108 however able to hydrate herself at home.  We have provided her an additional liter of fluid.  On reassessment, her pulse rate is 101.  On chart review, the patient appears to be chronically tachycardic.  She is tachycardic which she comes in to be assessed for UTIs, STIs it was a pleasure taking part in your care.  As discussed, your workup is reassuring.  Please drink small amounts of alcohol in the future.  Please return to the ED with any new or worsening symptoms.  I sent home Zofran .  Please take this every 6 hours as needed for nausea and vomiting.  It was a pleasure  taking part in your care.  It was a pleasure and was a pleasure taking part in your care.  As well as other presentations.  At this time, feel patient is stable to discharge home.  She was advised to follow-up with her PCP.  She was encouraged to return to the ED with any new or worsening symptoms.  She is stable to discharge.      Final diagnoses:  Nausea and vomiting, unspecified vomiting type    ED Discharge Orders          Ordered    ondansetron  (ZOFRAN ) 4  MG tablet  Every 6 hours        12/25/23 0148               Ruthell Lonni FALCON, PA-C 12/25/23 0223    Francesca Elsie CROME, MD 12/26/23 [phone]

## 2023-12-24 NOTE — ED Provider Notes (Incomplete)
 [facility] EMERGENCY DEPARTMENT AT [facility] Provider Note   CSN: [id number] Arrival date & time: 12/24/23  8060     Patient presents with: Emesis   Patricia Sullivan is a 23 y.o. female with history of Patricia Sullivan of kidney.  Patient presents to ED for evaluation of nausea, vomiting.  Reports that last night she was at her friend's quinceanera.  States that she had a significant amount of alcohol.  She reports that she was mixing dark and brown liquor.  The patient does not remember leaving the party apparently should be carried out.  She states that at the party she began having nausea and vomiting which persisted into this morning.  She states that she is unable to keep anything down on her stomach.  She endorses nausea, vomiting.  She denies any diarrhea.  She endorses abdominal pain but states abdominal pain only present when she is actively throwing up.  Denies fevers at home.  Denies dysuria or flank pain.  Denies chest pain or shortness of breath.  Denies drug use.   Emesis      Prior to Admission medications   Medication Sig Start Date End Date Taking? Authorizing Provider  ondansetron  (ZOFRAN ) 4 MG tablet Take 1 tablet (4 mg total) by mouth every 8 (eight) hours as needed for nausea or vomiting. 11/08/22   Blue, Soijett A, PA-C    Allergies: Patient has no known allergies.    Review of Systems  Gastrointestinal:  Positive for vomiting.    Updated Vital Signs BP 93/75   Pulse (!) 117   Temp 98 F (36.7 C) (Oral)   Resp 18   Ht 5' 2 (1.575 m)   Wt 52.2 kg   LMP 12/11/2023   SpO2 97%   BMI 21.03 kg/m   Physical Exam  (all labs ordered are listed, but only abnormal results are displayed) Labs Reviewed  COMPREHENSIVE METABOLIC PANEL WITH GFR - Abnormal; Notable for the following components:      Result Value   Potassium 3.4 (*)    CO2 20 (*)    Glucose, Bld 100 (*)    Total Protein 8.2 (*)    Total Bilirubin 1.5 (*)    All other components within normal  limits  URINALYSIS, ROUTINE W REFLEX MICROSCOPIC - Abnormal; Notable for the following components:   APPearance HAZY (*)    Ketones, ur 80 (*)    Protein, ur 100 (*)    Leukocytes,Ua TRACE (*)    Bacteria, UA RARE (*)    All other components within normal limits  LIPASE, BLOOD  CBC  HCG, SERUM, QUALITATIVE  RAPID URINE DRUG SCREEN, HOSP PERFORMED  CBG MONITORING, ED    EKG: None  Radiology: No results found.  {Document cardiac monitor, telemetry assessment procedure when appropriate:32947} Procedures   Medications Ordered in the ED  sodium chloride  0.9 % bolus 1,000 mL (has no administration in time range)  ondansetron  (ZOFRAN ) injection 4 mg (has no administration in time range)  potassium chloride  SA (KLOR-CON  M) CR tablet 40 mEq (has no administration in time range)  ondansetron  (ZOFRAN -ODT) disintegrating tablet 4 mg (4 mg Oral Given 12/24/23 2012)      {Click here for ABCD2, HEART and other calculators REFRESH Note before signing:1}                              Medical Decision Making Amount and/or Complexity of Data Reviewed Labs: ordered.  Risk Prescription drug management.   ***  {Document critical care time when appropriate  Document review of labs and clinical decision tools ie CHADS2VASC2, etc  Document your independent review of radiology images and any outside records  Document your discussion with family members, caretakers and with consultants  Document social determinants of health affecting pt's care  Document your decision making why or why not admission, treatments were needed:32947:::1}   Final diagnoses:  None    ED Discharge Orders     None

## 2023-12-24 NOTE — ED Triage Notes (Signed)
 Pt reports:  Vomiting Started yesterday Drank a lot of alcohol yesterday Estimated 20 times vomiting today Unable to drink anything Unable to eat

## 2023-12-25 MED ORDER — ONDANSETRON HCL 4 MG PO TABS: 4.0000 mg | ORAL_TABLET | Freq: Four times a day (QID) | ORAL | 0 refills | Status: AC

## 2023-12-25 MED ORDER — SODIUM CHLORIDE 0.9 % IV BOLUS
1000.0000 mL | Freq: Once | INTRAVENOUS | Status: AC
  Administered 2023-12-25: 1000 mL via INTRAVENOUS

## 2023-12-25 NOTE — Discharge Instructions (Addendum)
 It was a pleasure taking part in your care. As discussed, your workup is reassuring. Please decrease the amount you drink in the future. Please follow up with your PCP for reevaluation. I have sent zofran  to your pharmacy, please take this every 6 hours as needed for nausea. Return to the ED with any new or worsening symptoms.

## 2024-02-28 ENCOUNTER — Other Ambulatory Visit: Payer: Self-pay

## 2024-02-28 ENCOUNTER — Ambulatory Visit
Admission: RE | Admit: 2024-02-28 | Discharge: 2024-02-28 | Disposition: A | Discharge status: Home / Self Care | Source: Ambulatory Visit | Attending: Family Medicine | Admitting: Family Medicine

## 2024-02-28 VITALS — BP 111/75 | HR 78 | Temp 98.0°F | Resp 17

## 2024-02-28 DIAGNOSIS — Z20822 Contact with and (suspected) exposure to covid-19: Secondary | ICD-10-CM | POA: Diagnosis present

## 2024-02-28 DIAGNOSIS — B349 Viral infection, unspecified: Secondary | ICD-10-CM | POA: Insufficient documentation

## 2024-02-28 DIAGNOSIS — R051 Acute cough: Secondary | ICD-10-CM | POA: Diagnosis present

## 2024-02-28 DIAGNOSIS — Z113 Encounter for screening for infections with a predominantly sexual mode of transmission: Secondary | ICD-10-CM | POA: Diagnosis present

## 2024-02-28 LAB — POC SOFIA SARS ANTIGEN FIA: SARS Coronavirus 2 Ag: NEGATIVE

## 2024-02-28 NOTE — ED Provider Notes (Signed)
 [facility]    CSN: [id number] Arrival date & time: 02/28/24  0911      History   Chief Complaint Chief Complaint  Patient presents with   Cough    Swore throat and check up - Entered by patient    HPI Patricia Sullivan is a 23 y.o. female  presents for evaluation of URI symptoms for 4 days. Patient reports associated symptoms of cough, congestion, sore throat and 1 episode of vomiting and diarrhea. Denies fevers, ear pain, body aches, shortness of breath. Patient does not have a hx of asthma. Patient does smoke marijuana but denies tobacco use.  Reports sister has COVID.  Pt has taken cough drops OTC for symptoms.  Patient also like STD screening.  She denies any known exposure or symptoms including vaginal discharge, dysuria, vaginal rashes.  Pt has no other concerns at this time.    Cough Associated symptoms: sore throat     Past Medical History:  Diagnosis Date   Horseshoe kidney     Patient Active Problem List   Diagnosis Date Noted   Chlamydia trachomatis infection in pregnancy 08/01/2022   Adjustment disorder with mixed anxiety and depressed mood 05/05/2020    History reviewed. No pertinent surgical history.  OB History     Gravida  1   Para      Term      Preterm      AB      Living  0      SAB      IAB      Ectopic      Multiple      Live Births               Home Medications    Prior to Admission medications   Medication Sig Start Date End Date Taking? Authorizing Provider  ondansetron  (ZOFRAN ) 4 MG tablet Take 1 tablet (4 mg total) by mouth every 6 (six) hours. 12/25/23   Ruthell Lonni FALCON, PA-C    Family History Family History  Problem Relation Age of Onset   Healthy Mother    Healthy Father     Social History Social History   Tobacco Use   Smoking status: Never   Smokeless tobacco: Never  Vaping Use   Vaping status: Never Used  Substance Use Topics   Alcohol use: Yes    Comment: occ   Drug use: Yes     Types: Marijuana    Comment: daily use     Allergies   Patient has no known allergies.   Review of Systems Review of Systems  HENT:  Positive for congestion and sore throat.   Respiratory:  Positive for cough.   Gastrointestinal:  Positive for diarrhea and vomiting.     Physical Exam Triage Vital Signs ED Triage Vitals  Encounter Vitals Group     BP 02/28/24 0925 111/75     Girls Systolic BP Percentile --      Girls Diastolic BP Percentile --      Boys Systolic BP Percentile --      Boys Diastolic BP Percentile --      Pulse Rate 02/28/24 0925 78     Resp 02/28/24 0925 17     Temp 02/28/24 0925 98 F (36.7 C)     Temp Source 02/28/24 0925 Oral     SpO2 02/28/24 0925 98 %     Weight --      Height --  Head Circumference --      Peak Flow --      Pain Score 02/28/24 0923 5     Pain Loc --      Pain Education --      Exclude from Growth Chart --    No data found.  Updated Vital Signs BP 111/75   Pulse 78   Temp 98 F (36.7 C) (Oral)   Resp 17   LMP 02/06/2024   SpO2 98%   Breastfeeding No   Visual Acuity Right Eye Distance:   Left Eye Distance:   Bilateral Distance:    Right Eye Near:   Left Eye Near:    Bilateral Near:     Physical Exam Vitals and nursing note reviewed.  Constitutional:      General: She is not in acute distress.    Appearance: She is well-developed. She is not ill-appearing.  HENT:     Head: Normocephalic and atraumatic.     Right Ear: Tympanic membrane and ear canal normal.     Left Ear: Tympanic membrane and ear canal normal.     Nose: No congestion.     Mouth/Throat:     Mouth: Mucous membranes are moist.     Pharynx: Oropharynx is clear. Uvula midline. Posterior oropharyngeal erythema present.     Tonsils: No tonsillar exudate or tonsillar abscesses.  Eyes:     Conjunctiva/sclera: Conjunctivae normal.     Pupils: Pupils are equal, round, and reactive to light.  Cardiovascular:     Rate and Rhythm: Normal rate  and regular rhythm.     Heart sounds: Normal heart sounds.  Pulmonary:     Effort: Pulmonary effort is normal.     Breath sounds: Normal breath sounds. No wheezing or rhonchi.  Musculoskeletal:     Cervical back: Normal range of motion and neck supple.  Lymphadenopathy:     Cervical: No cervical adenopathy.  Skin:    General: Skin is warm and dry.  Neurological:     General: No focal deficit present.     Mental Status: She is alert and oriented to person, place, and time.  Psychiatric:        Mood and Affect: Mood normal.        Behavior: Behavior normal.      UC Treatments / Results  Labs (all labs ordered are listed, but only abnormal results are displayed) Labs Reviewed  RPR  HIV ANTIBODY (ROUTINE TESTING W REFLEX)  POC [name] SARS ANTIGEN FIA  CERVICOVAGINAL ANCILLARY ONLY    EKG   Radiology No results found.  Procedures Procedures (including critical care time)  Medications Ordered in UC Medications - No data to display  Initial Impression / Assessment and Plan / UC Course  I have reviewed the triage vital signs and the nursing notes.  Pertinent labs & imaging results that were available during my care of the patient were reviewed by me and considered in my medical decision making (see chart for details).     Reviewed exam and symptoms with patient.  No red flags.  Negative COVID testing.  Discussed viral illness and symptomatic treatment.  STD testing is ordered and will contact for any positive results.  Advise rest fluids and PCP follow-up if symptoms do not improve.  ER precautions reviewed Final Clinical Impressions(s) / UC Diagnoses   Final diagnoses:  Screening examination for STD (sexually transmitted disease)  Exposure to COVID-19 virus  Acute cough  Viral illness  Discharge Instructions      The clinic will contact you with results of the STD testing done today if positive.  You tested negative for COVID.  Please treat your symptoms  with over the counter cough medication, tylenol or ibuprofen , humidifier, and rest. Viral illnesses can last 7-14 days. Please follow up with your PCP if your symptoms are not improving. Please go to the ER for any worsening symptoms. This includes but is not limited to fever you can not control with tylenol or ibuprofen , you are not able to stay hydrated, you have shortness of breath or chest pain.  Thank you for choosing [facility] for your healthcare needs. I hope you feel better soon!      ED Prescriptions   None    PDMP not reviewed this encounter.   Loreda Myla SAUNDERS, NP 02/28/24 1001

## 2024-02-28 NOTE — Discharge Instructions (Addendum)
 The clinic will contact you with results of the STD testing done today if positive.  You tested negative for COVID.  Please treat your symptoms with over the counter cough medication, tylenol or ibuprofen , humidifier, and rest. Viral illnesses can last 7-14 days. Please follow up with your PCP if your symptoms are not improving. Please go to the ER for any worsening symptoms. This includes but is not limited to fever you can not control with tylenol or ibuprofen , you are not able to stay hydrated, you have shortness of breath or chest pain.  Thank you for choosing [facility] for your healthcare needs. I hope you feel better soon!

## 2024-02-28 NOTE — ED Triage Notes (Signed)
 Pt stastes her sister has COVID and she was around here. Pt c/o dry cough, red bumps in back of throat, sore throat, dysphagiax4d.

## 2024-02-29 LAB — HIV ANTIBODY (ROUTINE TESTING W REFLEX): HIV Screen 4th Generation wRfx: NONREACTIVE

## 2024-02-29 LAB — CERVICOVAGINAL ANCILLARY ONLY
Chlamydia: NEGATIVE
Comment: NEGATIVE
Comment: NEGATIVE
Comment: NORMAL
Neisseria Gonorrhea: NEGATIVE
Trichomonas: NEGATIVE

## 2024-02-29 LAB — RPR: RPR Ser Ql: NONREACTIVE
